# Patient Record
Sex: Female | Born: 1980 | Hispanic: Yes | Smoking: Never smoker
Health system: Southern US, Community
[De-identification: ages and names within clinical notes are randomized; demographics above are authoritative.]

## PROBLEM LIST (undated history)

## (undated) ENCOUNTER — Emergency Department (HOSPITAL_COMMUNITY): Payer: Self-pay | Source: Home / Self Care

## (undated) ENCOUNTER — Inpatient Hospital Stay (HOSPITAL_COMMUNITY): Payer: Self-pay

## (undated) DIAGNOSIS — D259 Leiomyoma of uterus, unspecified: Secondary | ICD-10-CM

## (undated) DIAGNOSIS — N939 Abnormal uterine and vaginal bleeding, unspecified: Secondary | ICD-10-CM

## (undated) DIAGNOSIS — D509 Iron deficiency anemia, unspecified: Secondary | ICD-10-CM

## (undated) DIAGNOSIS — Z973 Presence of spectacles and contact lenses: Secondary | ICD-10-CM

## (undated) DIAGNOSIS — N946 Dysmenorrhea, unspecified: Secondary | ICD-10-CM

## (undated) DIAGNOSIS — G43909 Migraine, unspecified, not intractable, without status migrainosus: Secondary | ICD-10-CM

## (undated) HISTORY — PX: NO PAST SURGERIES: SHX2092

---

## 2004-08-05 ENCOUNTER — Ambulatory Visit: Payer: Self-pay | Admitting: *Deleted

## 2004-08-09 ENCOUNTER — Ambulatory Visit: Payer: Self-pay | Admitting: *Deleted

## 2004-08-13 ENCOUNTER — Inpatient Hospital Stay (HOSPITAL_COMMUNITY): Admission: AD | Admit: 2004-08-13 | Discharge: 2004-08-17 | Payer: Self-pay | Admitting: *Deleted

## 2004-08-13 ENCOUNTER — Ambulatory Visit: Payer: Self-pay | Admitting: Obstetrics and Gynecology

## 2005-09-21 ENCOUNTER — Inpatient Hospital Stay (HOSPITAL_COMMUNITY): Admission: AD | Admit: 2005-09-21 | Discharge: 2005-09-21 | Payer: Self-pay | Admitting: Obstetrics

## 2005-09-28 ENCOUNTER — Inpatient Hospital Stay (HOSPITAL_COMMUNITY): Admission: AD | Admit: 2005-09-28 | Discharge: 2005-09-30 | Payer: Self-pay | Admitting: Obstetrics

## 2010-02-24 ENCOUNTER — Emergency Department (HOSPITAL_COMMUNITY): Admission: EM | Admit: 2010-02-24 | Discharge: 2010-02-24 | Payer: Self-pay | Admitting: Emergency Medicine

## 2010-12-26 LAB — DIFFERENTIAL
Lymphocytes Relative: 25 % (ref 12–46)
Lymphs Abs: 2.1 10*3/uL (ref 0.7–4.0)
Monocytes Relative: 9 % (ref 3–12)
Neutrophils Relative %: 64 % (ref 43–77)

## 2010-12-26 LAB — CBC
MCV: 88.3 fL (ref 78.0–100.0)
Platelets: 240 10*3/uL (ref 150–400)
RBC: 4.56 MIL/uL (ref 3.87–5.11)
WBC: 8.7 10*3/uL (ref 4.0–10.5)

## 2010-12-26 LAB — SEDIMENTATION RATE: Sed Rate: 21 mm/hr (ref 0–22)

## 2013-01-15 ENCOUNTER — Inpatient Hospital Stay (HOSPITAL_COMMUNITY): Payer: Self-pay

## 2013-01-15 ENCOUNTER — Inpatient Hospital Stay (HOSPITAL_COMMUNITY)
Admission: AD | Admit: 2013-01-15 | Discharge: 2013-01-15 | Disposition: A | Discharge status: Home / Self Care | Payer: Self-pay | Source: Ambulatory Visit | Attending: Obstetrics & Gynecology | Admitting: Obstetrics & Gynecology

## 2013-01-15 DIAGNOSIS — O4690 Antepartum hemorrhage, unspecified, unspecified trimester: Secondary | ICD-10-CM

## 2013-01-15 DIAGNOSIS — O209 Hemorrhage in early pregnancy, unspecified: Secondary | ICD-10-CM | POA: Insufficient documentation

## 2013-01-15 DIAGNOSIS — R1032 Left lower quadrant pain: Secondary | ICD-10-CM | POA: Insufficient documentation

## 2013-01-15 LAB — URINALYSIS, ROUTINE W REFLEX MICROSCOPIC
Glucose, UA: NEGATIVE mg/dL
Ketones, ur: NEGATIVE mg/dL
Leukocytes, UA: NEGATIVE
Nitrite: NEGATIVE
Specific Gravity, Urine: 1.01 (ref 1.005–1.030)
pH: 6.5 (ref 5.0–8.0)

## 2013-01-15 LAB — CBC
Hemoglobin: 12.4 g/dL (ref 12.0–15.0)
MCH: 30.2 pg (ref 26.0–34.0)
MCHC: 34.2 g/dL (ref 30.0–36.0)
RBC: 4.11 MIL/uL (ref 3.87–5.11)
RDW: 13 % (ref 11.5–15.5)
WBC: 10 10*3/uL (ref 4.0–10.5)

## 2013-01-15 LAB — WET PREP, GENITAL
Clue Cells Wet Prep HPF POC: NONE SEEN
Yeast Wet Prep HPF POC: NONE SEEN

## 2013-01-15 LAB — POCT PREGNANCY, URINE: Preg Test, Ur: POSITIVE — AB

## 2013-01-15 MED ORDER — KETOROLAC TROMETHAMINE 30 MG/ML IJ SOLN: 30.0000 mg | Freq: Once | INTRAMUSCULAR | Status: DC

## 2013-01-15 MED ORDER — KETOROLAC TROMETHAMINE 30 MG/ML IJ SOLN
INTRAMUSCULAR | Status: AC
  Administered 2013-01-15: 30 mg
  Filled 2013-01-15: qty 1

## 2013-01-15 NOTE — MAU Note (Signed)
Patient states she has had a positive home pregnancy test. States she had slight pink discharge this am with some abdominal cramping.

## 2013-01-15 NOTE — MAU Note (Signed)
In MAU satellite area BP  12/67  Pulse 70  Temp 37.1 C, c/o pain 5/10 lower left, also HA had started since she has been waiting

## 2013-01-15 NOTE — MAU Provider Note (Signed)
History     CSN: [id number]  Arrival date and time: 01/15/13 1436   First Provider Initiated Contact with Patient 01/15/13 1731      Chief Complaint  Patient presents with  . Vaginal Bleeding  . Abdominal Pain   HPI  Pt is G3P2 with positive home pregnancy test today- LMP 11/05/2012. Pt was previously on Depo Dec 2012.  Pt c/o of LLQ pain and spotting today- pain is mild.  Pt also is complaining of right sided headache.  Pt denies chills or fever, constipation, diarrhea, or UTI symptoms.    No past medical history on file.  No past surgical history on file.  No family history on file.  History  Substance Use Topics  . Smoking status: Not on file  . Smokeless tobacco: Not on file  . Alcohol Use: Not on file    Allergies: Allergies not on file  No prescriptions prior to admission    ROS Physical Exam   Blood pressure 109/68, pulse 62, temperature 98.4 F (36.9 C), temperature source Oral, resp. rate 16, height 4\' 10"  (1.473 m), weight 50.894 kg (112 lb 3.2 oz), last menstrual period 11/05/2012, SpO2 100.00%.  Physical Exam  Vitals reviewed. Constitutional: She is oriented to person, place, and time. She appears well-developed and well-nourished.  HENT:  Head: Normocephalic.  Eyes: Pupils are equal, round, and reactive to light.  Neck: Normal range of motion. Neck supple.  Cardiovascular: Normal rate.   Respiratory: Effort normal.  GI: Soft. She exhibits no distension. There is no tenderness. There is no rebound.  Genitourinary:  Small amount creamy discharge in vault; cervix closed, NT; uterus NSSC NT; adnexa slightly tender with palpation- no rebound  Musculoskeletal: Normal range of motion.  Neurological: She is alert and oriented to person, place, and time.  Skin: Skin is warm and dry.  Psychiatric: She has a normal mood and affect.    MAU Course  Procedures Results for orders placed during the hospital encounter of 01/15/13 (from the past 24 hour(s))   URINALYSIS, ROUTINE W REFLEX MICROSCOPIC     Status: None   Collection Time    01/15/13  3:15 PM      Result Value Range   Color, Urine YELLOW  YELLOW   APPearance CLEAR  CLEAR   Specific Gravity, Urine 1.010  1.005 - 1.030   pH 6.5  5.0 - 8.0   Glucose, UA NEGATIVE  NEGATIVE mg/dL   Hgb urine dipstick NEGATIVE  NEGATIVE   Bilirubin Urine NEGATIVE  NEGATIVE   Ketones, ur NEGATIVE  NEGATIVE mg/dL   Protein, ur NEGATIVE  NEGATIVE mg/dL   Urobilinogen, UA 0.2  0.0 - 1.0 mg/dL   Nitrite NEGATIVE  NEGATIVE   Leukocytes, UA NEGATIVE  NEGATIVE  POCT PREGNANCY, URINE     Status: Abnormal   Collection Time    01/15/13  3:23 PM      Result Value Range   Preg Test, Ur POSITIVE (*) NEGATIVE  CBC     Status: None   Collection Time    01/15/13  5:35 PM      Result Value Range   WBC 10.0  4.0 - 10.5 K/uL   RBC 4.11  3.87 - 5.11 MIL/uL   Hemoglobin 12.4  12.0 - 15.0 g/dL   HCT 40.9  81.1 - 91.4 %   MCV 88.3  78.0 - 100.0 fL   MCH 30.2  26.0 - 34.0 pg   MCHC 34.2  30.0 - 36.0 g/dL  RDW 13.0  11.5 - 15.5 %   Platelets 248  150 - 400 K/uL  HCG, QUANTITATIVE, PREGNANCY     Status: Abnormal   Collection Time    01/15/13  5:35 PM      Result Value Range   hCG, Beta Chain, Quant, S 8698 (*) <5 mIU/mL  ABO/RH     Status: None   Collection Time    01/15/13  5:35 PM      Result Value Range   ABO/RH(D) O POS    WET PREP, GENITAL     Status: Abnormal   Collection Time    01/15/13  6:00 PM      Result Value Range   Yeast Wet Prep HPF POC NONE SEEN  NONE SEEN   Trich, Wet Prep NONE SEEN  NONE SEEN   Clue Cells Wet Prep HPF POC NONE SEEN  NONE SEEN   WBC, Wet Prep HPF POC FEW (*) NONE SEEN  US Ob Comp Less 14 Wks  01/15/2013  *RADIOLOGY REPORT*  Clinical Data: Vaginal bleeding.  OBSTETRIC <14 WK ULTRASOUND, TRANSVAGINAL OB US  Technique:  Transabdominal and transvaginal ultrasound was performed for evaluation of the gestation as well as the maternal uterus and adnexal regions.  Findings:   Single intrauterine gestational sac.  This may contain a yolk sac but is barely visible.  No embryo or cardiac activity identified.  The mean sac diameter equals 1.2 the centimeters.  This corresponds to a 6-week-0-day gestation.  [facility] 09/10/2013.  Maternal uterus/adnexae: Small subchorionic hemorrhage is noted.  The ovaries are normal.  No free fluid identified within the pelvis.  IMPRESSION:  1.  Single intrauterine gestational sac which may contain a barely visible yolk sac.  No embryo identified. 2.  Mean sac diameter 1.2 cm without embryo may reflect early pregnancy.  However, the estimated gestational age of [redacted] weeks and 0 days is discordant with the clinical gestational age of [redacted] weeks and 1 day.  Serial quantitative beta HCG and pelvic sonogram is advised. 3.  Small subchorionic hemorrhage   Original Report Authenticated By: Signa Kell, M.D.    US Ob Transvaginal  01/15/2013  *RADIOLOGY REPORT*  Clinical Data: Vaginal bleeding.  OBSTETRIC <14 WK ULTRASOUND, TRANSVAGINAL OB US  Technique:  Transabdominal and transvaginal ultrasound was performed for evaluation of the gestation as well as the maternal uterus and adnexal regions.  Findings:  Single intrauterine gestational sac.  This may contain a yolk sac but is barely visible.  No embryo or cardiac activity identified.  The mean sac diameter equals 1.2 the centimeters.  This corresponds to a 6-week-0-day gestation.  [facility] 09/10/2013.  Maternal uterus/adnexae: Small subchorionic hemorrhage is noted.  The ovaries are normal.  No free fluid identified within the pelvis.  IMPRESSION:  1.  Single intrauterine gestational sac which may contain a barely visible yolk sac.  No embryo identified. 2.  Mean sac diameter 1.2 cm without embryo may reflect early pregnancy.  However, the estimated gestational age of [redacted] weeks and 0 days is discordant with the clinical gestational age of [redacted] weeks and 1 day.  Serial quantitative beta HCG and pelvic sonogram is advised. 3.   Small subchorionic hemorrhage   Original Report Authenticated By: Signa Kell, M.D.   Discussed with Dr. Despina Hidden- will have [pt return in 1 week for ultrasound for viability Assessment and Plan  Bleeding in pregnancy- return in 1 week for repeat ultrasound   Abbrielle Batts 01/15/2013, 5:34 PM

## 2013-01-16 LAB — GC/CHLAMYDIA PROBE AMP: CT Probe RNA: NEGATIVE

## 2013-01-19 ENCOUNTER — Inpatient Hospital Stay (HOSPITAL_COMMUNITY)
Admission: AD | Admit: 2013-01-19 | Discharge: 2013-01-19 | Disposition: A | Discharge status: Home / Self Care | Payer: Self-pay | Source: Ambulatory Visit | Attending: Obstetrics and Gynecology | Admitting: Obstetrics and Gynecology

## 2013-01-19 ENCOUNTER — Encounter (HOSPITAL_COMMUNITY): Payer: Self-pay | Admitting: *Deleted

## 2013-01-19 DIAGNOSIS — O039 Complete or unspecified spontaneous abortion without complication: Secondary | ICD-10-CM | POA: Insufficient documentation

## 2013-01-19 LAB — CBC
MCHC: 34.5 g/dL (ref 30.0–36.0)
MCV: 87.1 fL (ref 78.0–100.0)
Platelets: 241 10*3/uL (ref 150–400)
RDW: 12.6 % (ref 11.5–15.5)
WBC: 12.8 10*3/uL — ABNORMAL HIGH (ref 4.0–10.5)

## 2013-01-19 LAB — HCG, QUANTITATIVE, PREGNANCY: hCG, Beta Chain, Quant, S: 6688 m[IU]/mL — ABNORMAL HIGH (ref <5)

## 2013-01-19 MED ORDER — HYDROCODONE-ACETAMINOPHEN 5-300 MG PO TABS: 1.0000 | ORAL_TABLET | ORAL | Status: DC | PRN

## 2013-01-19 MED ORDER — KETOROLAC TROMETHAMINE 60 MG/2ML IM SOLN
60.0000 mg | Freq: Once | INTRAMUSCULAR | Status: AC
  Administered 2013-01-19: 60 mg via INTRAMUSCULAR
  Filled 2013-01-19: qty 2

## 2013-01-19 NOTE — MAU Note (Signed)
t reports she has had increased pelvic pain x 2 weeks and stomach and darrhea x 3 days. Also reports having n/v x 2-3 days (stated she is throwing up blood). Was seen at [facility] yesterday and the sent her home not knowing what was wrong.

## 2013-01-19 NOTE — MAU Provider Note (Signed)
  History     CSN: [id number]  Arrival date and time: 01/19/13 0865   First Provider Initiated Contact with Patient 01/19/13 2005      Chief Complaint  Patient presents with  . Vaginal Bleeding  . Abdominal Pain   HPI  Diane Vaughn is a 32 y.o. G3P2002 at [redacted]w[redacted]d who presents today with pain and bleeding. She states that she has been bleeding since Wednesday, and then today she started having 10/10 pain. She states that the bleeding is heavier than a period, and she is passing large clots.    No past medical history on file.  No past surgical history on file.  No family history on file.  History  Substance Use Topics  . Smoking status: Never Smoker   . Smokeless tobacco: Never Used  . Alcohol Use: No    Allergies: No Known Allergies  Prescriptions prior to admission  Medication Sig Dispense Refill  . Prenatal Vit-Fe Fumarate-FA (PRENATAL MULTIVITAMIN) TABS Take 1 tablet by mouth daily at 12 noon.        Review of Systems  Constitutional: Negative for fever.  Eyes: Negative for blurred vision.  Respiratory: Negative for shortness of breath.   Cardiovascular: Negative for chest pain.  Gastrointestinal: Positive for abdominal pain. Negative for nausea, vomiting, diarrhea and constipation.  Genitourinary: Negative for dysuria, urgency and frequency.  Musculoskeletal: Negative for myalgias.  Neurological: Negative for dizziness and headaches.   Physical Exam   Blood pressure 106/72, pulse 92, temperature 98.2 F (36.8 C), temperature source Oral, resp. rate 18, height 4\' 10"  (1.473 m), weight 111 lb (50.349 kg), last menstrual period 11/05/2012, SpO2 100.00%.  Physical Exam  Nursing note and vitals reviewed. Constitutional: She is oriented to person, place, and time. She appears well-developed and well-nourished. No distress.  Cardiovascular: Normal rate.   Respiratory: Effort normal.  GI: Soft.  Genitourinary:   External: no lesion Vagina: large  amount of blood and clots in the vagina Cervix: small amount of decidual appearing tissue in the os, easily removed. Cervical os slightly dilated Uterus: small, non-tender  Neurological: She is alert and oriented to person, place, and time.  Skin: Skin is warm and dry.  Psychiatric: She has a normal mood and affect.    MAU Course  Procedures  Results for orders placed during the hospital encounter of 01/19/13 (from the past 24 hour(s))  HCG, QUANTITATIVE, PREGNANCY     Status: Abnormal   Collection Time    01/19/13  8:21 PM      Result Value Range   hCG, Beta Chain, Sharene Butters, [place] 7846 (*) <5 mIU/mL  CBC     Status: Abnormal   Collection Time    01/19/13  8:21 PM      Result Value Range   WBC 12.8 (*) 4.0 - 10.5 K/uL   RBC 3.96  3.87 - 5.11 MIL/uL   Hemoglobin 11.9 (*) 12.0 - 15.0 g/dL   HCT 96.2 (*) 95.2 - 84.1 %   MCV 87.1  78.0 - 100.0 fL   MCH 30.1  26.0 - 34.0 pg   MCHC 34.5  30.0 - 36.0 g/dL   RDW 32.4  40.1 - 02.7 %   Platelets 241  150 - 400 K/uL     Assessment and Plan   1. Abortion, spontaneous    With falling HCG levels Bleeding precautions given FU in 2 weeks for repeat HCG.   Tawnya Crook 01/19/2013, 8:22 PM

## 2013-01-19 NOTE — MAU Note (Signed)
Specimen was sent to pathology.

## 2013-01-19 NOTE — MAU Note (Signed)
Pt reports continued vaginal bleeding and pain, was here on 01/15/2013. States has been bleeding since but it increased today and pain worsened

## 2013-01-21 NOTE — MAU Provider Note (Signed)
Attestation of Attending Supervision of Advanced Practitioner: Evaluation and management procedures were performed by the PA/NP/CNM/OB Fellow under my supervision/collaboration. Chart reviewed and agree with management and plan.  Gaius Ishaq V 01/21/2013 12:00 PM

## 2013-01-22 ENCOUNTER — Ambulatory Visit (HOSPITAL_COMMUNITY): Admission: RE | Admit: 2013-01-22 | Payer: Self-pay | Source: Ambulatory Visit

## 2013-02-13 ENCOUNTER — Other Ambulatory Visit: Payer: Self-pay

## 2013-02-13 DIAGNOSIS — O039 Complete or unspecified spontaneous abortion without complication: Secondary | ICD-10-CM

## 2013-02-27 ENCOUNTER — Encounter: Payer: Self-pay | Admitting: Obstetrics & Gynecology

## 2013-02-27 ENCOUNTER — Ambulatory Visit (INDEPENDENT_AMBULATORY_CARE_PROVIDER_SITE_OTHER): Payer: Self-pay | Admitting: Obstetrics & Gynecology

## 2013-02-27 VITALS — BP 111/77 | HR 80 | Temp 97.8°F | Ht <= 58 in | Wt 115.2 lb

## 2013-02-27 DIAGNOSIS — O039 Complete or unspecified spontaneous abortion without complication: Secondary | ICD-10-CM

## 2013-02-27 DIAGNOSIS — Z3009 Encounter for other general counseling and advice on contraception: Secondary | ICD-10-CM | POA: Insufficient documentation

## 2013-02-27 MED ORDER — NORGESTIM-ETH ESTRAD TRIPHASIC 0.18/0.215/0.25 MG-35 MCG PO TABS: 1.0000 | ORAL_TABLET | Freq: Every day | ORAL | Status: DC

## 2013-02-27 NOTE — Progress Notes (Signed)
Patient ID: Diane Vaughn, female   DOB: Aug 15, 1981, 32 y.o.   MRN: [id number]  Chief Complaint  Patient presents with  . Follow-up    SAB; no bleeding     HPI Diane Vaughn is a 32 y.o. female.  [id number] Patient's last menstrual period was 02/19/2013. Spontaneous miscarriage 4/13 at 6 weeks by [place], menses started 1 week ago, wants DMPA but cant get into [facility]. Would try [facility]   HPI  No past medical history on file.  No past surgical history on file.  No family history on file.  Social History History  Substance Use Topics  . Smoking status: Never Smoker   . Smokeless tobacco: Never Used  . Alcohol Use: No    No Known Allergies  Current Outpatient Prescriptions  Medication Sig Dispense Refill  . Prenatal Vit-Fe Fumarate-FA (PRENATAL MULTIVITAMIN) TABS Take 1 tablet by mouth daily at 12 noon.      . Hydrocodone-Acetaminophen (VICODIN) 5-300 MG TABS Take 1 tablet by mouth every 4 (four) hours as needed (pain).  15 each  0  . Norgestimate-Ethinyl Estradiol Triphasic 0.18/0.215/0.25 MG-35 MCG tablet Take 1 tablet by mouth daily.  1 Package  11   No current facility-administered medications for this visit.    Review of Systems Review of Systems  Constitutional: Negative for fever.  Genitourinary: Positive for vaginal bleeding. Negative for dysuria, vaginal discharge, menstrual problem and pelvic pain.    Blood pressure 111/77, pulse 80, temperature 97.8 F (36.6 C), temperature source Oral, height 4\' 9"  (1.448 m), weight 115 lb 3.2 oz (52.254 kg), last menstrual period 02/19/2013, unknown if currently breastfeeding.  Physical Exam Physical Exam  Constitutional: She appears well-developed. No distress.  Pulmonary/Chest: No respiratory distress.  Psychiatric: She has a normal mood and affect. Her behavior is normal.    Data Reviewed [place], MAU note  Assessment    Complete Sab     Plan    Tri Sprintec         ARNOLD,JAMES 02/27/2013, 3:04  PM

## 2013-02-27 NOTE — Patient Instructions (Signed)
Uso de los anticonceptivos orales  (Oral Contraception Use) Los anticonceptivos orales son medicamentos que se utilizan para Location manager. Su funcin es [employer] ovarios liberen vulos. Las hormonas de los anticonceptivos orales hacen que el moco cervical se haga ms espeso, lo que evita que el esperma ingrese al tero. Tambin hacen que la membrana que tapiza el tero se vuelva ms fina, lo que no permite que el huevo fertilizado se adhiera a la pared del tero. Los anticonceptivos orales son muy efectivos cuando se toman exactamente como se prescriben. Sin embargo, los anticonceptivos orales no previenen contra las enfermedades de transmisin sexual (ETS). La prctica del sexo seguro, como el uso de preservativos, junto con los anticonceptivos [place], [place] a prevenir ese tipo de enfermedades. Antes de tomar la pldora, usted debe hacerse un examen fsico y un [place]. El mdico podr indicarle anlisis de [place], si es necesario. El mdico se asegurar de que usted es una buena candidata para usar anticonceptivos orales. Converse con su mdico acerca de los posibles efectos secundarios de los anticonceptivos [place]. Cuando se inicia el uso de anticonceptivos [place], se pueden tomar durante 2 a 3 meses para que el cuerpo se adapte a los cambios en los niveles hormonales en el cuerpo.  CMO TOMAR LOS ANTICONCEPTIVOS ORALES  El mdico le indicar como comenzar a Surveyor, minerals ciclo de anticonceptivos orales. De lo contrario usted puede:   Psychiatric nurse. da del ciclo menstrual. No necesitar proteccin anticonceptiva adicional al Investment banker, operational.  Comenzar Financial risk analyst domingo luego de su perodo menstrual, o Medical laboratory scientific officer en que adquiere el [employer]. En estos casos deber [employer] proteccin anticonceptiva [employer] primeros 7 das del [place]. Luego de comenzar a tomar los anticonceptivos orales:   Si olvid de tomar 1 pldora, tmela tan pronto como lo recuerde. Tome la  siguiente pldora a la hora habitual.  Si olvida tomar 2  ms pldoras, utilice un mtodo anticonceptivo adicional hasta que comience su prximo perodo menstrual.  Si utiliza el envase de 28 pldoras y [place] tomar 1 de las ltimas 7 (pldoras sin hormonas), sto no tiene [place] manager. Simplemente deseche el resto de las pldoras que no contienen hormonas y comience un nuevo envase. No importa cuando comience a tomar los anticonceptivos, siempre empiece un nuevo envase el mismo da de la [place]. Tenga un envase extra de pldoras anticonceptivas y use un mtodo anticonceptivo adicional para el caso en que se olvide de tomar algunas pldoras o pierda la caja.  INSTRUCCIONES PARA EL CUIDADO DOMICILIARIO  No fume.  Siempre use un condn para protegerse de las enfermedades de transmisin sexual. Los anticonceptivos orales no protegen contra las enfermedades de transmisin sexual.  Research scientist (medical) en un calendario las fechas en las que tiene sus perodos [place].  Lea la informacin y consejos que vienen con las pldoras. Pngase en contacto con el mdico siempre que tenga preguntas. SOLICITE ATENCIN MDICA SI:  Presenta nuseas o vmitos.  Tiene flujo o sangrado vaginal anormal.  Aparece una erupcin cutnea.  No tiene el perodo menstrual.  Pierde el cabello.  Necesita tratamiento por cambios en su estado de nimo o por depresin.  Se siente mareada al tomar la pldora.  Comienza a aparecer acn con el uso de los anticonceptivos orales.  Ardelle Anton. SOLICITE ATENCIN MDICA DE INMEDIATO SI:  Siente dolor en el pecho.  Le falta el aire.  Le duele mucho la cabeza y no puede Human resources officer.  Siente adormecimiento o tiene  dificultad para hablar.  Tiene problemas de visin.  Presenta dolor, inflamacin o hinchazn en las piernas. Document Released: 09/14/2011 Document Revised: 12/18/2011 [facility] Patient Information 2014 [place], [place].

## 2014-02-26 IMAGING — US US OB COMP LESS 14 WK
1 series · 14 of 28 positions shown · non-contrast
Comparison: none

CLINICAL DATA: Vaginal bleeding.

OBSTETRIC <14 WK ULTRASOUND, TRANSVAGINAL OB US
TECHNIQUE: Transabdominal and transvaginal ultrasound was
performed for evaluation of the gestation as well as the maternal
uterus and adnexal regions.

[Series 1: us ob comp less 14 wks · 14 of 35 slices shown]
[im 2/35]
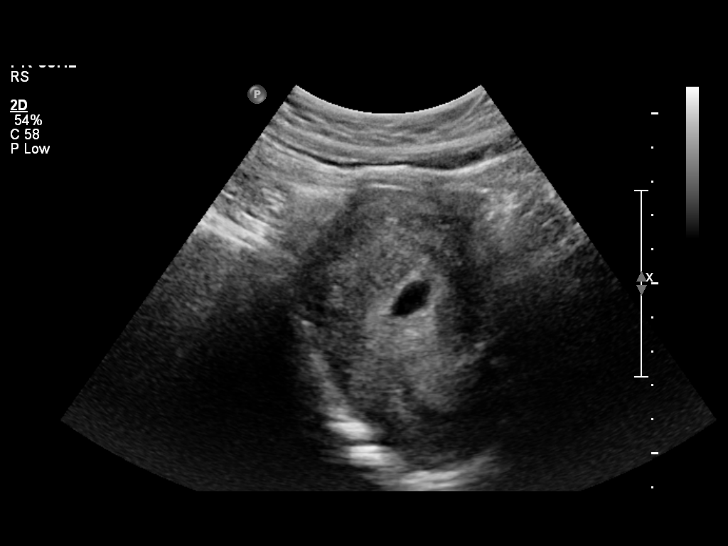
[im 4/35]
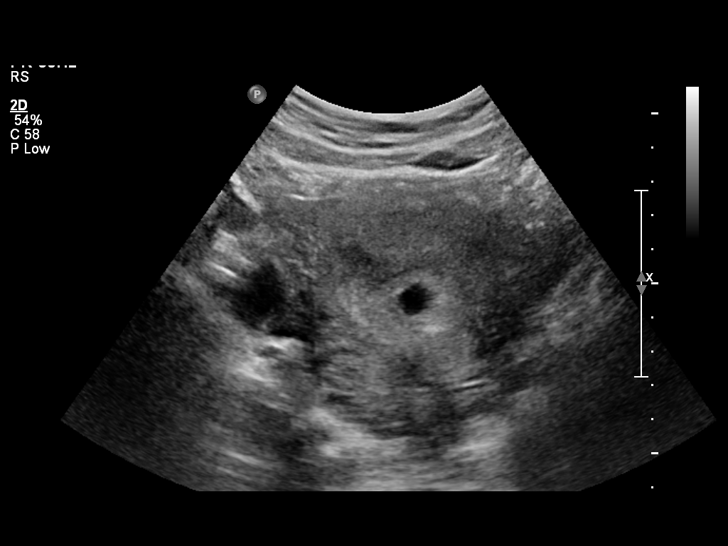
[im 7/35]
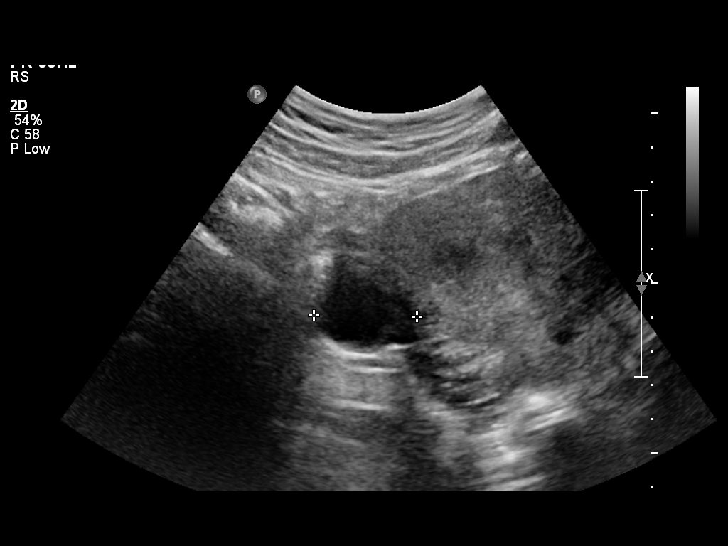
[im 9/35]
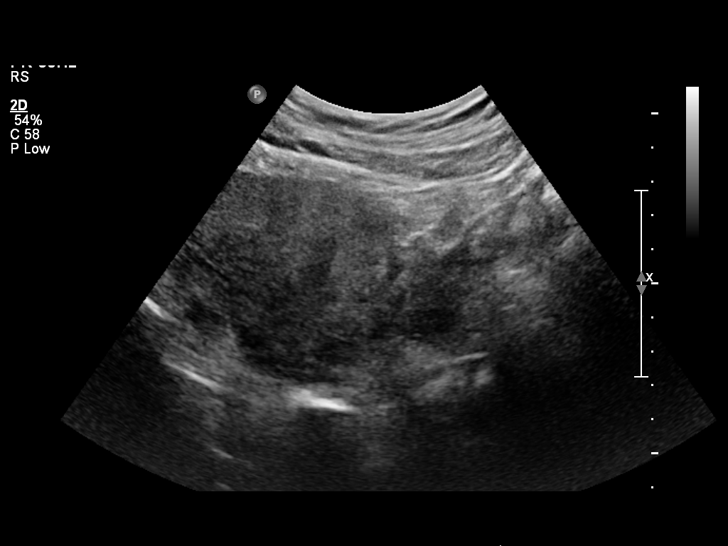
[im 12/35]
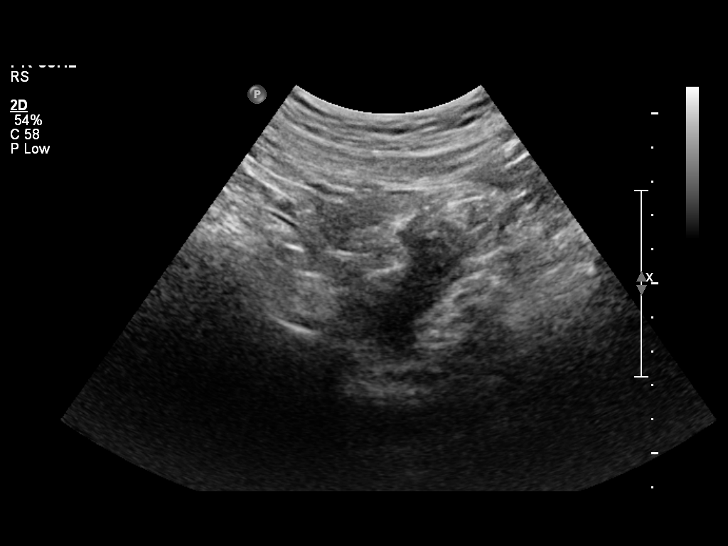
[im 14/35]
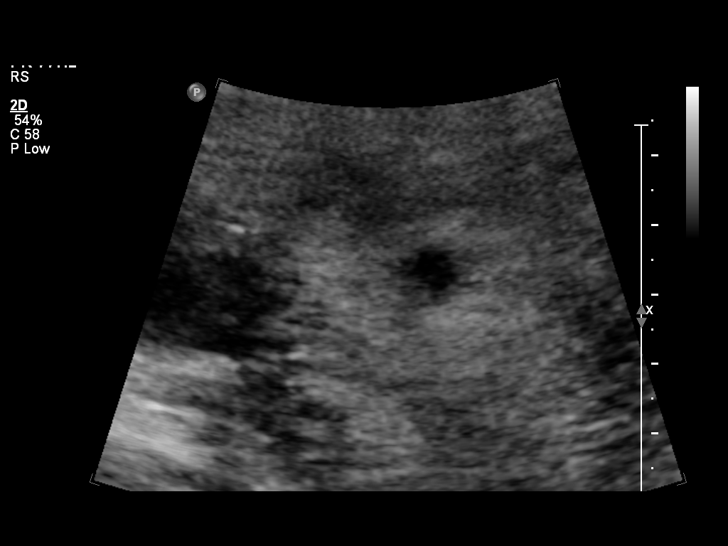
[im 17/35]
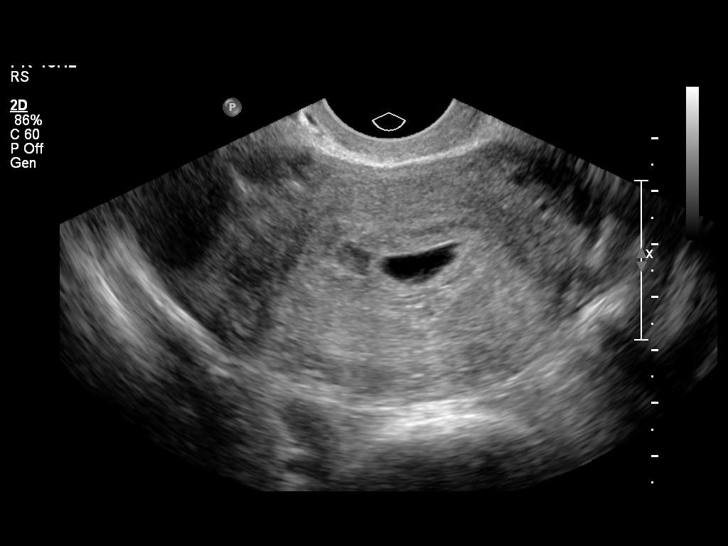
[im 19/35]
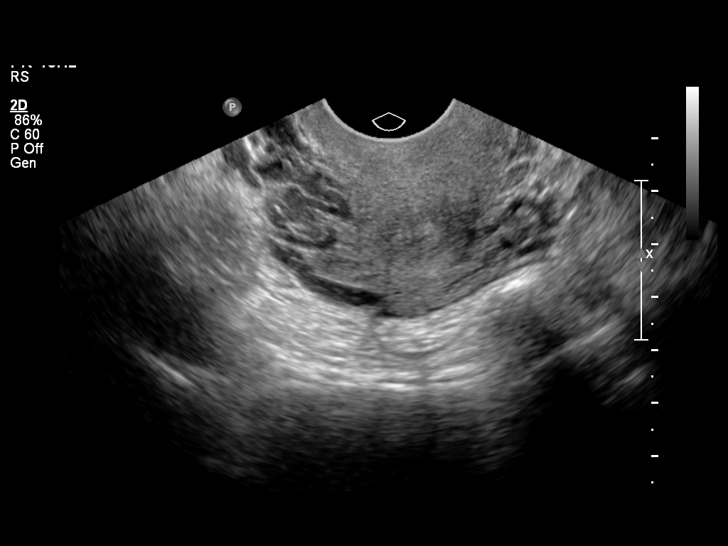
[im 22/35]
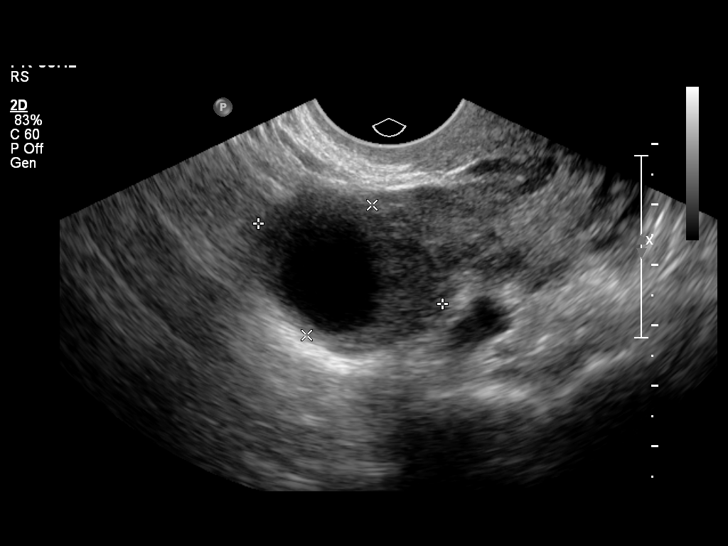
[im 24/35]
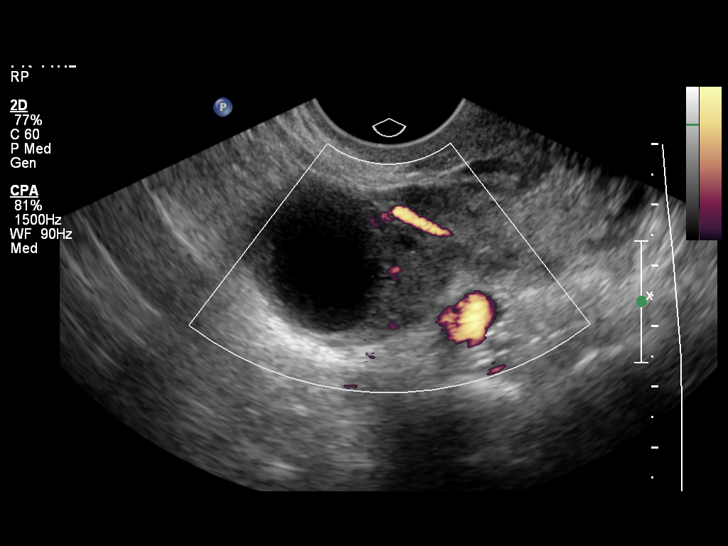
[im 27/35]
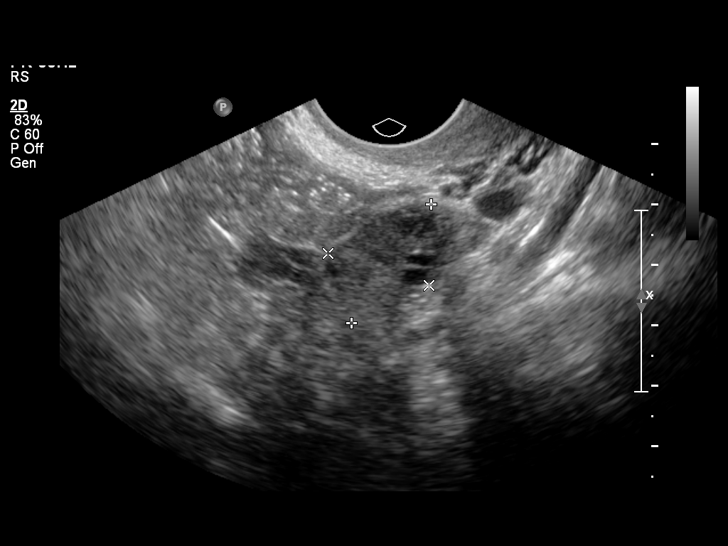
[im 29/35]
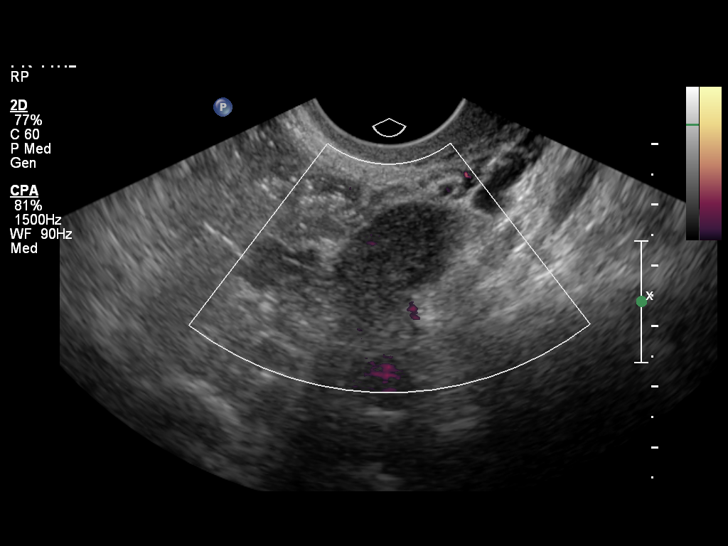
[im 32/35]
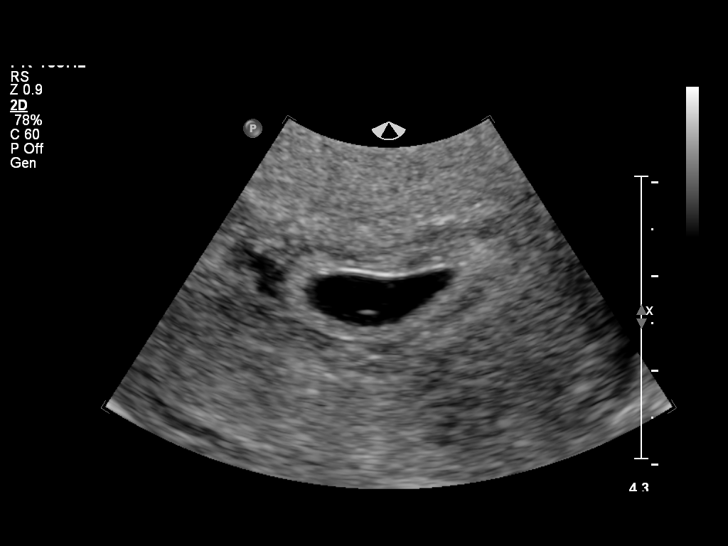
[im 35/35]
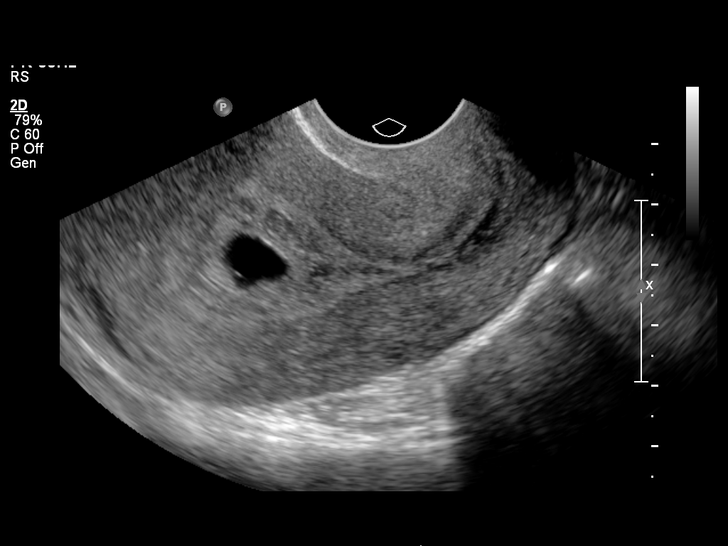

[14 of 28 positions shown; findings below may reference images not displayed]

FINDINGS: Single intrauterine gestational sac.  This may contain a yolk sac
but is barely visible.

No embryo or cardiac activity identified.

The mean sac diameter equals 1.2 the centimeters.  This corresponds
to a 6-week-4-day gestation.

[facility] 09/10/2013.

Maternal uterus/adnexae:
Small subchorionic hemorrhage is noted.

The ovaries are normal.

No free fluid identified within the pelvis.
IMPRESSION: 1.  Single intrauterine gestational sac which may contain a barely
visible yolk sac.  No embryo identified.
2.  Mean sac diameter 1.2 cm without embryo may reflect early
pregnancy.  However, the estimated gestational age of 6 weeks and 0
days is discordant with the clinical gestational age of 10 weeks
and 1 day.  Serial quantitative beta HCG and pelvic sonogram is
advised.
3.  Small subchorionic hemorrhage

## 2014-08-10 ENCOUNTER — Encounter: Payer: Self-pay | Admitting: Obstetrics & Gynecology

## 2015-10-08 ENCOUNTER — Encounter: Payer: Self-pay | Admitting: Neurology

## 2015-10-08 ENCOUNTER — Ambulatory Visit (INDEPENDENT_AMBULATORY_CARE_PROVIDER_SITE_OTHER): Payer: Self-pay | Admitting: Neurology

## 2015-10-08 VITALS — BP 100/68 | HR 66 | Ht <= 58 in | Wt 109.3 lb

## 2015-10-08 DIAGNOSIS — IMO0002 Reserved for concepts with insufficient information to code with codable children: Secondary | ICD-10-CM

## 2015-10-08 DIAGNOSIS — G43709 Chronic migraine without aura, not intractable, without status migrainosus: Secondary | ICD-10-CM

## 2015-10-08 MED ORDER — AMITRIPTYLINE HCL 10 MG PO TABS: 10.0000 mg | ORAL_TABLET | Freq: Every day | ORAL | Status: DC

## 2015-10-08 MED ORDER — NAPROXEN 500 MG PO TABS: 500.0000 mg | ORAL_TABLET | Freq: Two times a day (BID) | ORAL | Status: DC | PRN

## 2015-10-08 MED ORDER — SUMATRIPTAN SUCCINATE 100 MG PO TABS: ORAL_TABLET | ORAL | Status: DC

## 2015-10-08 NOTE — Progress Notes (Signed)
NEUROLOGY CONSULTATION NOTE  Lavena Loretto MRN: [id number] DOB: 1981-05-17  Referring provider: Caffie Pinto, PA Primary care provider: none  Reason for consult:  migraine  HISTORY OF PRESENT ILLNESS: Diane Vaughn is a 34 year old right-handed Spanish-speaking female who presents for migraines.  Spanish interpretor present.  History obtained by patient via Spanish interpretor, as well as PCP note.  Onset:  Since 34 years old, increased frequency for 3 years Location:  Right sided, peri-orbital/temporl Quality:  throbbing Intensity:  10/10 when severe Aura:  Visual (blurred vision in right eye, followed by stars) Prodrome:  no Associated symptoms:  Nausea, vomiting, photophobia, phonophobia Duration:  3 days Frequency:  Every week (approximately 20 headache days per month, 8 days severe) Triggers/exacerbating factors:  Lack of sleep Relieving factors:  Fioricet (lessens pain) Activity:  Cannot function when severe  Past abortive medication:  Ibuprofen, Advil, Tylenol Past preventative medication:  none Other past therapy:  Teas  Current abortive medication:  Bupap -  (takes only when severe) Antihypertensive medications:  none Antidepressant medications:  none Anticonvulsant medications:  none Vitamins/Herbal/Supplements:  none Other therapy:  none Other medication:  none  Caffeine:  2-3 cups of coffee daily Alcohol:  no Smoker:  no Diet:  Does not keep hydrated Exercise:  no Depression/stress:  Work-related stress (works at [employer] for breakfast shift) Sleep hygiene:  Poor (gets up at 3 am to go to work) Family history of headache:  Mother's side  PAST MEDICAL HISTORY: History reviewed. No pertinent past medical history.  PAST SURGICAL HISTORY: History reviewed. No pertinent past surgical history.  MEDICATIONS: Current Outpatient Prescriptions on File Prior to Visit  Medication Sig Dispense Refill  . Norgestimate-Ethinyl Estradiol  Triphasic 0.18/0.215/0.25 MG-35 MCG tablet Take 1 tablet by mouth daily. 1 Package 11   No current facility-administered medications on file prior to visit.    ALLERGIES: No Known Allergies  FAMILY HISTORY: History reviewed. No pertinent family history.  SOCIAL HISTORY: Social History   Social History  . Marital Status: Single    Spouse Name: N/A  . Number of Children: N/A  . Years of Education: N/A   Occupational History  . Not on file.   Social History Main Topics  . Smoking status: Never Smoker   . Smokeless tobacco: Never Used  . Alcohol Use: No  . Drug Use: No  . Sexual Activity: Yes    Birth Control/ Protection: None   Other Topics Concern  . Not on file   Social History Narrative    REVIEW OF SYSTEMS: Constitutional: No fevers, chills, or sweats, no generalized fatigue, change in appetite Eyes: No visual changes, double vision, eye pain Ear, nose and throat: No hearing loss, ear pain, nasal congestion, sore throat Cardiovascular: No chest pain, palpitations Respiratory:  No shortness of breath at rest or with exertion, wheezes GastrointestinaI: No nausea, vomiting, diarrhea, abdominal pain, fecal incontinence Genitourinary:  No dysuria, urinary retention or frequency Musculoskeletal:  No neck pain, back pain Integumentary: No rash, pruritus, skin lesions Neurological: as above Psychiatric: No depression, insomnia, anxiety Endocrine: No palpitations, fatigue, diaphoresis, mood swings, change in appetite, change in weight, increased thirst Hematologic/Lymphatic:  No anemia, purpura, petechiae. Allergic/Immunologic: no itchy/runny eyes, nasal congestion, recent allergic reactions, rashes  PHYSICAL EXAM: Filed Vitals:   10/08/15 0811  BP: 100/68  Pulse: 66   General: No acute distress.  Patient appears well-groomed.  Head:  Normocephalic/atraumatic Eyes:  fundi unremarkable, without vessel changes, exudates, hemorrhages or papilledema. Neck: supple, no  paraspinal tenderness,  full range of motion Back: No paraspinal tenderness Heart: regular rate and rhythm Lungs: Clear to auscultation bilaterally. Vascular: No carotid bruits. Neurological Exam: Mental status: alert and oriented to person, place, and time, recent and remote memory intact, fund of knowledge intact, attention and concentration intact, speech fluent and not dysarthric, language intact. Cranial nerves: CN I: not tested CN II: pupils equal, round and reactive to light, visual fields intact, fundi unremarkable, without vessel changes, exudates, hemorrhages or papilledema. CN III, IV, VI:  full range of motion, no nystagmus, no ptosis CN V: facial sensation intact CN VII: upper and lower face symmetric CN VIII: hearing intact CN IX, X: gag intact, uvula midline CN XI: sternocleidomastoid and trapezius muscles intact CN XII: tongue midline Bulk & Tone: normal, no fasciculations. Motor:  5/5 throughout Sensation: temperature and vibration sensation intact. Deep Tendon Reflexes:  2+ throughout, toes downgoing.  Finger to nose testing:  Without dysmetria.  Heel to shin:  Without dysmetria.  Gait:  Normal station and stride.  Able to turn and tandem walk. Romberg negative.  IMPRESSION: Chronic migraine  PLAN: 1.  She thinks she may be pregnant because she missed her period but first home pregnancy test was negative.  She must follow up with the health clinic to verify.  If she is pregnant, then treatment is limited.  She may take magnesium oxide 400mg  to 600mg  daily and tylenol as needed. 2.  If she is not pregnant, we can start amitriptyline 10mg  at bedtime and we can prescribe sumatriptan 100mg  with naproxen 500mg  for abortive therapy.  She should stop Fioricet. 3.  Lifestyle modification discussed 4.  Follow up in 3 months  45 minutes spent face to face with patient, over 50% spent discussing diagnosis and management.  Thank you for allowing me to take part in the care of  this patient.  Shon MilletAdam Mykaylah Ballman, DO  CC:  Lindaann PascalScott Long, [place]

## 2015-10-08 NOTE — Patient Instructions (Addendum)
Migraa Recomendaciones: 1. Comience a amitriptilina 10 mg a la hora de [employer] Llame en 4 semanas con la actualizacin y podemos ajustar la dosis si es necesario. 2. Tomar sumatriptn en la ms temprana aparicin de dolor de [place]. Puede repetir la dosis una vez en 2 horas si es necesario. No exceda de dos tabletas en 24 horas. Si esto es ineficaz, tome cada dosis de sumatriptn con naproxeno 500 mg 3. Limite el uso de analgsicos a no ms de 2 das de [employer]. Estos medicamentos incluyen acetaminofeno, ibuprofeno, triptanos y narcticos. Esto ayudar a Software engineer de dolores de cabeza de rebote. 4. Tenga en cuenta los desencadenantes comunes de los alimentos, tales como los dulces procesados, los alimentos procesados ??con nitritos (tales como carne deli, perritos calientes, salchichas), alimentos con MSG, alcohol (como el vino), chocolate, ciertos quesos, , Algunos ctricos), vinagre, soda diettica. 4. Evite la cafena 5. Ejercicio de rutina 6. Buena higiene del sueo 7. Mantngase adecuadamente hidratado con agua 8. Mantenga un diario de dolor de [place]. 9. Mantener una gestin adecuada del estrs. 10. No salte las comidas. 11. Considere suplementos: xido de magnesio  a  diario, riboflavina , Coenzyme Q 10  tres veces al da 12. Seguimiento en 3 meses, pero LLAME EN 4 SEMANAS CON ACTUALIZACIN  SI EST EMBARAZADA, NO PUEDE TOMAR LA AMITRIPTYLINE O SUMATRIPTAN. USTED PUEDE TOMAR EL MAGNESIO  A  DIARIAMENTE Y SLO TYLENOL AS NECESARIO. VERIFIQUE CON SU MDICO SI EST EMBARAZADA O NO. SI USTED NO ES, ENTONCES CIF USTED EST EMBARAZADA, NO PUEDE TOMAR LA AMITRIPTYLINE O SUMATRIPTAN. USTED PUEDE TOMAR EL MAGNESIO  A  DIARIAMENTE Y SLO TYLENOL AS NECESARIO. VERIFIQUE CON SU MDICO SI EST EMBARAZADA O NO. SI USTED NO ES, ENTONCES PODEMOS PRESCRIBIR AQUELLOS MEDICAMENTOS.EN PRESCRIBIR ESTOS MEDICAMENTOS.     Recommendations: 1.  Start  amitriptyline  at bedtime.  Call in 4 weeks with update and we can adjust dose if needed. 2.  Take sumatriptan at earliest onset of headache.  May repeat dose once in 2 hours if needed.  Do not exceed two tablets in 24 hours.  If this is ineffective, then take each dose of sumatriptan with naproxen  3.  Limit use of pain relievers to no more than 2 days out of the week.  These medications include acetaminophen, ibuprofen, triptans and narcotics.  This will help reduce risk of rebound headaches. 4.  Be aware of common food triggers such as processed sweets, processed foods with nitrites (such as deli meat, hot dogs, sausages), foods with MSG, alcohol (such as wine), chocolate, certain cheeses, certain fruits (dried fruits, some citrus fruit), vinegar, diet soda. 4.  Avoid caffeine 5.  Routine exercise 6.  Proper sleep hygiene 7.  Stay adequately hydrated with water 8.  Keep a headache diary. 9.  Maintain proper stress management. 10.  Do not skip meals. 11.  Consider supplements:  Magnesium oxide  to  daily, riboflavin , Coenzyme Q 10  three times daily 12.  Follow up in 3 months but CALL IN 4 WEEKS WITH UPDATE   IF YOU ARE PREGNANT, YOU CANNOT TAKE THE AMITRIPTYLINE OR SUMATRIPTAN.  YOU MAY TAKE THE MAGNESIUM  TO  DAILY AND ONLY TYLENOL AS NEEDED.  VERIFY WITH YOUR DOCTOR WHETHER YOU ARE PREGNANT OR NOT.  IF YOU ARE NOT, THEN WE CIF YOU ARE PREGNANT, YOU CANNOT TAKE THE AMITRIPTYLINE OR SUMATRIPTAN.  YOU MAY TAKE THE MAGNESIUM  TO  DAILY AND ONLY TYLENOL AS NEEDED.  VERIFY  WITH YOUR DOCTOR WHETHER YOU ARE PREGNANT OR NOT.  IF YOU ARE NOT, THEN WE CAN PRESCRIBE THOSE MEDICATIONS.AN PRESCRIBE THOSE MEDICATIONS.

## 2015-10-08 NOTE — Progress Notes (Signed)
Note routed

## 2015-10-12 ENCOUNTER — Other Ambulatory Visit: Payer: Self-pay | Admitting: *Deleted

## 2015-10-12 MED ORDER — NAPROXEN 500 MG PO TABS: 500.0000 mg | ORAL_TABLET | Freq: Two times a day (BID) | ORAL | Status: DC | PRN

## 2015-10-12 MED ORDER — AMITRIPTYLINE HCL 10 MG PO TABS: 10.0000 mg | ORAL_TABLET | Freq: Every day | ORAL | Status: DC

## 2015-10-12 MED ORDER — SUMATRIPTAN SUCCINATE 100 MG PO TABS: 100.0000 mg | ORAL_TABLET | ORAL | Status: DC | PRN

## 2016-01-19 ENCOUNTER — Encounter: Payer: Self-pay | Admitting: Neurology

## 2016-01-19 ENCOUNTER — Ambulatory Visit (INDEPENDENT_AMBULATORY_CARE_PROVIDER_SITE_OTHER): Payer: Self-pay | Admitting: Neurology

## 2016-01-19 VITALS — BP 120/76 | HR 88 | Ht <= 58 in | Wt 110.2 lb

## 2016-01-19 DIAGNOSIS — G43009 Migraine without aura, not intractable, without status migrainosus: Secondary | ICD-10-CM

## 2016-01-19 MED ORDER — RIZATRIPTAN BENZOATE 10 MG PO TABS: ORAL_TABLET | ORAL | Status: DC

## 2016-01-19 NOTE — Patient Instructions (Addendum)
1. Contine la amitriptilina 10 mg a la hora de [place]. Yo continuara los suplementos, ya que usted est haciendo bien. Puede detenerlos y si Performance Food Groupvuelven los dolores de [place], basta con reiniciarlos. 2. Detener el sumatriptn. En su lugar, tomar Maxalt (rizatriptan) 10 mg en el inicio ms temprano de dolor de cabeza. Puede repetir la dosis en 2 horas si es necesario. No exceda de 2 dosis en 24 horas. Usted puede intentar tomar cada dosis de rizatriptn con 500 mg de naproxeno. 3. Trate de mantener el mismo horario de sueo el domingo para ver si eso evita el dolor de cabeza 4. Seguimiento en 5 meses, pero llame con preguntas o inquietudes.

## 2016-01-19 NOTE — Progress Notes (Signed)
NEUROLOGY FOLLOW UP OFFICE NOTE  Diane Vaughn [id number]  HISTORY OF PRESENT ILLNESS: Diane Vaughn is a 35 year old right-handed Spanish-speaking female who follows up for migraines.  Spanish interpretor present.    UPDATE: She was not pregnant, so she was prescribed amitriptyline and sumatriptan .  She has had improvement in headaches, however she had side effects to sumatriptan Intensity:  5/10 Duration:  4-5 hours Frequency:  3 days in month of March Current NSAIDS:  Naproxen  Current analgesics:  no Current triptans:  Sumatriptan 50 to  Current anti-emetic:  no Current muscle relaxants:  no Current anti-anxiolytic:  no Current sleep aide:  no Current Antihypertensive medications:  no Current Antidepressant medications:  Amitriptyline  Current Anticonvulsant medications:  no Current Vitamins/Herbal/Supplements:  Magnesium, riboflavin Current Antihistamines/Decongestants:  No  She notices that on Sundays, when she sleeps longer since it is her day off, her headaches seem to occur more frequently.  HISTORY: Onset:  Since 35 years old, increased frequency for 3 years Location:  Right sided, peri-orbital/temporl Quality:  throbbing Intensity:  10/10 when severe Aura:  Visual (blurred vision in right eye, followed by stars) Prodrome:  no Associated symptoms:  Nausea, vomiting, photophobia, phonophobia Duration:  3 days Frequency:  Every week (approximately 20 headache days per month, 8 days severe) Triggers/exacerbating factors:  Lack of sleep Relieving factors:  Fioricet (lessens pain) Activity:  Cannot function when severe  Past abortive medication:  Ibuprofen, Advil, Tylenol Past preventative medication:  none Other past therapy:  Teas  Current abortive medication:  Bupap -  (takes only when severe) Antihypertensive medications:  none Antidepressant medications:  none Anticonvulsant medications:   none Vitamins/Herbal/Supplements:  none Other therapy:  none Other medication:  none  Caffeine:  2-3 cups of coffee daily Alcohol:  no Smoker:  no Diet:  Does not keep hydrated Exercise:  no Depression/stress:  Work-related stress (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  MEDICATIONS: Current Outpatient Prescriptions on File Prior to Visit  Medication Sig Dispense Refill  . amitriptyline (ELAVIL) 10 MG tablet Take 1 tablet (10 mg total) by mouth at bedtime. 30 tablet 3  . naproxen (NAPROSYN) 500 MG tablet Take 1 tablet (500 mg total) by mouth every 12 (twelve) hours as needed. 60 tablet 2  . Norgestimate-Ethinyl Estradiol Triphasic 0.18/0.215/0.25 MG-35 MCG tablet Take 1 tablet by mouth daily. 1 Package 11   No current facility-administered medications on file prior to visit.    ALLERGIES: No Known Allergies  FAMILY HISTORY: No family history on file.  SOCIAL HISTORY: Social History   Social History  . Marital Status: Single    Spouse Name: N/A  . Number of Children: N/A  . Years of Education: N/A   Occupational History  . Not on file.   Social History Main Topics  . Smoking status: Never Smoker   . Smokeless tobacco: Never Used  . Alcohol Use: No  . Drug Use: No  . Sexual Activity: Yes    Birth Control/ Protection: None   Other Topics Concern  . Not on file   Social History Narrative    REVIEW OF SYSTEMS: Constitutional: No fevers, chills, or sweats, no generalized fatigue, change in appetite Eyes: No visual changes, double vision, eye pain Ear, nose and throat: No hearing loss, ear pain, nasal congestion, sore throat Cardiovascular: No  chest pain, palpitations Respiratory:  No shortness of breath at rest or with exertion, wheezes GastrointestinaI: No nausea, vomiting, diarrhea, abdominal pain, fecal  incontinence Genitourinary:  No dysuria, urinary retention or frequency Musculoskeletal:  No neck pain, back pain Integumentary: No rash, pruritus, skin lesions Neurological: as above Psychiatric: No depression, insomnia, anxiety Endocrine: No palpitations, fatigue, diaphoresis, mood swings, change in appetite, change in weight, increased thirst Hematologic/Lymphatic:  No anemia, purpura, petechiae. Allergic/Immunologic: no itchy/runny eyes, nasal congestion, recent allergic reactions, rashes  PHYSICAL EXAM: Filed Vitals:   01/19/16 0929  BP: 120/76  Pulse: 88   General: No acute distress.  Patient appears well-groomed.  normal body habitus. Head:  Normocephalic/atraumatic Eyes:  Fundoscopic exam unremarkable without vessel changes, exudates, hemorrhages or papilledema. Neck: supple, no paraspinal tenderness, full range of motion Heart:  Regular rate and rhythm Lungs:  Clear to auscultation bilaterally Back: No paraspinal tenderness Neurological Exam: alert and oriented to person, place, and time. Attention span and concentration intact, recent and remote memory intact, fund of knowledge intact.  Speech fluent and not dysarthric, language intact.  CN II-XII intact. Fundoscopic exam unremarkable without vessel changes, exudates, hemorrhages or papilledema.  Bulk and tone normal, muscle strength 5/5 throughout.  Sensation to light touch, temperature and vibration intact.  Deep tendon reflexes 2+ throughout, toes downgoing.  Finger to nose and heel to shin testing intact.  Gait normal, Romberg negative.  IMPRESSION: Migraine without aura  PLAN: 1.  Continue amitriptyline 10mg  at bedtime.  Continue supplements since you are doing well. 2.  Instead of sumatriptan, will try rizatriptan 10mg .  May take each dose with naproxen 500mg  3.  Try keeping same sleep schedule on Sundays 4.  Follow up in 5 months.  15 minutes spent face to face with patient, over 50% spent discussing  management.  Shon MilletAdam Srah Ake, DO

## 2016-01-20 ENCOUNTER — Other Ambulatory Visit: Payer: Self-pay

## 2016-01-20 NOTE — Telephone Encounter (Signed)
Pt called because Rizatriptan was too expensive. Pt does not have insurance.  Pt was given this info:  Fax was sent back to pharmacy asking if they knew of cheaper alternative for pt.   Www.pparx.org  This private and confidential program provides medicine free of charge to eligible individuals, primarily the uninsured who, without our assistance, could not afford needed [employer] medicines. Individuals who don't meet the insurance criteria may still qualify for the [employer] Patient Assistance Program if they attest that they have special circumstances of financial and medical hardship, and their income meets the program criteria. A single application may provide for up to 1 year of medicine free of charge to eligible individuals and an individual may reapply as many times as needed. Contact Information  If you have any questions about the [employer] Patient Assistance Program including the status of an application, please call [phone], 8 AM to 8 PM EST, Monday through Friday.

## 2016-03-07 ENCOUNTER — Telehealth: Payer: Self-pay | Admitting: Neurology

## 2016-03-07 MED ORDER — AMITRIPTYLINE HCL 10 MG PO TABS: ORAL_TABLET | ORAL | Status: DC

## 2016-03-07 NOTE — Telephone Encounter (Signed)
I spoke with patients friend/Yolanda. Patient c/o medications not working. She has been taking amitriptyline & naproxen, patient states that meds only ease the pain a "little" bit. She is concerned that something else is wrong. Patient also states the Maxalt also didn't help.

## 2016-03-07 NOTE — Telephone Encounter (Signed)
Please have her increase amitriptyline to 20mg  at bedtime, since she is on a very low dose of the medication.  She can consider sooner f/u with Dr. Everlena CooperJaffe to discuss further.

## 2016-03-07 NOTE — Telephone Encounter (Signed)
Called and spoke with Diane Vaughn again and notified her of advisement of med change for patient. She verbalized good understanding. Will send in new Rx to patients pharmacy to reflect change. Advised for patient to let us know if increase is still of no help.

## 2016-03-07 NOTE — Telephone Encounter (Signed)
Diane MurdochMonica Vaughn March 15, 1981.  She would like you to please cal her at [phone]. This is her friends number. She does not speak english. She said that her Migraine medication is not helping her. She is concerned that something else may be wrong. Thank you

## 2016-03-14 ENCOUNTER — Encounter (HOSPITAL_COMMUNITY): Payer: Self-pay

## 2016-03-14 ENCOUNTER — Emergency Department (HOSPITAL_COMMUNITY)
Admission: EM | Admit: 2016-03-14 | Discharge: 2016-03-14 | Disposition: A | Discharge status: Home / Self Care | Payer: Self-pay | Attending: Emergency Medicine | Admitting: Emergency Medicine

## 2016-03-14 DIAGNOSIS — G43009 Migraine without aura, not intractable, without status migrainosus: Secondary | ICD-10-CM

## 2016-03-14 DIAGNOSIS — G43909 Migraine, unspecified, not intractable, without status migrainosus: Secondary | ICD-10-CM | POA: Insufficient documentation

## 2016-03-14 DIAGNOSIS — Z79899 Other long term (current) drug therapy: Secondary | ICD-10-CM | POA: Insufficient documentation

## 2016-03-14 HISTORY — DX: Migraine, unspecified, not intractable, without status migrainosus: G43.909

## 2016-03-14 MED ORDER — DIPHENHYDRAMINE HCL 50 MG/ML IJ SOLN
25.0000 mg | Freq: Once | INTRAMUSCULAR | Status: AC
  Administered 2016-03-14: 25 mg via INTRAMUSCULAR
  Filled 2016-03-14: qty 1

## 2016-03-14 MED ORDER — METOCLOPRAMIDE HCL 5 MG/ML IJ SOLN
10.0000 mg | Freq: Once | INTRAMUSCULAR | Status: AC
  Administered 2016-03-14: 10 mg via INTRAMUSCULAR
  Filled 2016-03-14: qty 2

## 2016-03-14 MED ORDER — DEXAMETHASONE SODIUM PHOSPHATE 10 MG/ML IJ SOLN
10.0000 mg | Freq: Once | INTRAMUSCULAR | Status: AC
  Administered 2016-03-14: 10 mg via INTRAMUSCULAR
  Filled 2016-03-14: qty 1

## 2016-03-14 NOTE — ED Provider Notes (Signed)
CSN: [id number]     Arrival date & time 03/14/16  1840 History   First MD Initiated Contact with Patient 03/14/16 2224     Chief Complaint  Patient presents with  . Headache     (Consider location/radiation/quality/duration/timing/severity/associated sxs/prior Treatment) HPI Comments: Patient presents with a headache. She has a history of migraines. She states that she's had a right-sided headache over the last week. Been gradually worsening over that time.  She has associated nausea and vomiting. She has a history of headaches and states that this is a same type of pain that she normally has with her migrainous type headaches. She denies any fevers. No neck stiffness. She's been using Maxalt at home without improvement in symptoms. She denies any unusual symptoms as compared to her typical migraines. History was obtained through the Spanish interpreter line.  Patient is a 35 y.o. female presenting with headaches.  Headache Associated symptoms: nausea and vomiting   Associated symptoms: no abdominal pain, no back pain, no congestion, no cough, no diarrhea, no dizziness, no fatigue, no fever, no neck pain, no numbness and no weakness     Past Medical History  Diagnosis Date  . Migraine    History reviewed. No pertinent past surgical history. No family history on file. Social History  Substance Use Topics  . Smoking status: Never Smoker   . Smokeless tobacco: Never Used  . Alcohol Use: No   OB History    Gravida Para Term Preterm AB TAB SAB Ectopic Multiple Living   Review of Systems  Constitutional: Negative for fever, chills, diaphoresis and fatigue.  HENT: Negative for congestion, rhinorrhea and sneezing.   Eyes: Negative.   Respiratory: Negative for cough, chest tightness and shortness of breath.   Cardiovascular: Negative for chest pain and leg swelling.  Gastrointestinal: Positive for nausea and vomiting. Negative for abdominal pain, diarrhea and blood in  stool.  Genitourinary: Negative for frequency, hematuria, flank pain and difficulty urinating.  Musculoskeletal: Negative for back pain, arthralgias and neck pain.  Skin: Negative for rash.  Neurological: Positive for headaches. Negative for dizziness, speech difficulty, weakness and numbness.      Allergies  Review of patient's allergies indicates no known allergies.  Home Medications   Prior to Admission medications   Medication Sig Start Date End Date Taking? Authorizing Provider  amitriptyline (ELAVIL) 10 MG tablet Take 2 tablets at bedtime 03/07/16   Donika K Patel, DO  magnesium citrate SOLN Take 1 Bottle by mouth once.    Historical Provider, MD  naproxen (NAPROSYN) 500 MG tablet Take 1 tablet (500 mg total) by mouth every 12 (twelve) hours as needed. 10/12/15   Drema Dallas, DO  Norgestimate-Ethinyl Estradiol Triphasic 0.18/0.215/0.25 MG-35 MCG tablet Take 1 tablet by mouth daily. 02/27/13   Adam Phenix, MD  Riboflavin (B-2 PO) Take by mouth.    Historical Provider, MD  rizatriptan (MAXALT) 10 MG tablet Take 1tab at earliest onset of headache.  May repeat x1 in 2 hours if needed.  Do not exceed 2 tabs in 24 hours 01/19/16   Drema Dallas, DO   BP 114/85 mmHg  Pulse 88  Temp(Src) 98.6 F (37 C) (Oral)  Resp 18  SpO2 100%  LMP 03/05/2016 Physical Exam  Constitutional: She is oriented to person, place, and time. She appears well-developed and well-nourished.  HENT:  Head: Normocephalic and atraumatic.  Eyes: Pupils are equal, round,  and reactive to light.  No photophobia  Neck: Normal range of motion. Neck supple.  No meningismus  Cardiovascular: Normal rate, regular rhythm and normal heart sounds.   Pulmonary/Chest: Effort normal and breath sounds normal. No respiratory distress. She has no wheezes. She has no rales. She exhibits no tenderness.  Abdominal: Soft. Bowel sounds are normal. There is no tenderness. There is no rebound and no guarding.  Musculoskeletal: Normal  range of motion. She exhibits no edema.  Lymphadenopathy:    She has no cervical adenopathy.  Neurological: She is alert and oriented to person, place, and time. She has normal strength. No cranial nerve deficit or sensory deficit. GCS eye subscore is 4. GCS verbal subscore is 5. GCS motor subscore is 6.  Skin: Skin is warm and dry. No rash noted.  Psychiatric: She has a normal mood and affect.    ED Course  Procedures (including critical care time) Labs Review Labs Reviewed - No data to display  Imaging Review No results found. I have personally reviewed and evaluated these images and lab results as part of my medical decision-making.   EKG Interpretation None      MDM   Final diagnoses:  Nonintractable migraine, unspecified migraine type    Patient presents with a migraine. She has no atypical symptoms that would be more suggestive of subarachnoid hemorrhage or meningitis. She was given a migraine cocktail and feels much better. She was discharged home in good condition. She is followed by low power neurology for her migraines. I encouraged her to follow-up with her neurologist if her symptoms are not improving or return here as needed for any worsening symptoms.    Rolan BuccoMelanie Sue Fernicola, MD 03/14/16 [phone]

## 2016-03-14 NOTE — Discharge Instructions (Signed)
Cefalea migrañosa  (Migraine Headache)  Una cefalea migrañosa es un dolor intenso y punzante en uno o ambos lados de la cabeza. La migraña puede durar desde 30 minutos hasta varias horas.  CAUSAS   No siempre se conoce la causa exacta de la cefalea migrañosa. Sin embargo, pueden aparecer cuando los nervios del cerebro se irritan y liberan ciertas sustancias químicas que causan inflamación. Esto ocasiona dolor.  Existen también ciertos factores que pueden desencadenar las migrañas, como los siguientes:  · Alcohol.  · Fumar.  · Estrés.  · La menstruación  · Quesos añejados.  · Los alimentos o las bebidas que contienen nitratos, glutamato, aspartamo o tiramina.  · Falta de sueño.  · Chocolate.  · Cafeína.  · Hambre.  · Actividad física extenuante.  · Fatiga.  · Medicamentos que se usan para tratar el dolor en el pecho (nitroglicerina), píldoras anticonceptivas, estrógeno y algunos medicamentos para la hipertensión arterial.  SIGNOS Y SÍNTOMAS  · Dolor en uno o ambos lados de la cabeza.  · Dolor pulsante o punzante.  · Dolor intenso que impide realizar las actividades diarias.  · Dolor que se agrava por cualquier actividad física.  · Náuseas, vómitos o ambos.  · Mareos.  · Dolor con la exposición a las luces brillantes, a los ruidos fuertes o la actividad.  · Sensibilidad general a las luces brillantes, a los ruidos fuertes o a los olores.  Antes de sufrir una migraña, puede recibir señales de advertencia (aura). Un aura puede incluir:  · Ver las luces intermitentes.  · Ver puntos brillantes, halos o líneas en zigzag.  · Tener una visión en túnel o visión borrosa.  · Sensación de entumecimiento u hormigueo.  · Dificultad para hablar  · Debilidad muscular.  DIAGNÓSTICO   La cefalea migrañosa se diagnostica en función de lo siguiente:  · Síntomas.  · Examen físico.  · Una TC (tomografía computada) o resonancia magnética de la cabeza. Estas pruebas de diagnóstico por imagen no pueden diagnosticar las migrañas, pero pueden  ayudar a descartar otras causas de las cefaleas.  TRATAMIENTO  Le prescribirán medicamentos para aliviar el dolor y las náuseas. También podrán administrarse medicamentos para ayudar a prevenir las migrañas recurrentes.   INSTRUCCIONES PARA EL CUIDADO EN EL HOGAR  · Sólo tome medicamentos de venta libre o recetados para calmar el dolor o el malestar, según las indicaciones de su médico. No se recomienda usar los opiáceos a largo plazo.  · Cuando tenga la migraña, acuéstese en un cuarto oscuro y tranquilo  · Lleve un registro diario para averiguar lo que puede provocar las cefaleas migrañosas. Por ejemplo, escriba:    Lo que usted come y bebe.    Cuánto tiempo duerme.    Algún cambio en su dieta o en los medicamentos.  · Limite el consumo de bebidas alcohólicas.  · Si fuma, deje de hacerlo.  · Duerma entre 7 y 9 horas, o según las recomendaciones del médico.  · Limite el estrés.  · Mantenga las luces tenues si le molestan las luces brillantes y la migraña empeora.  SOLICITE ATENCIÓN MÉDICA DE INMEDIATO SI:   · La migraña se hace cada vez más intensa.  · Tiene fiebre.  · Presenta rigidez en el cuello.  · Tiene pérdida de visión.  · Presenta debilidad muscular o pérdida del control muscular.  · Comienza a perder el equilibrio o tiene problemas para caminar.  · Sufre mareos o se desmaya.  · Tiene síntomas graves que son diferentes   a los primeros síntomas.  ASEGÚRESE DE QUE:   · Comprende estas instrucciones.  · Controlará su afección.  · Recibirá ayuda de inmediato si no mejora o si empeora.     Esta información no tiene como fin reemplazar el consejo del médico. Asegúrese de hacerle al médico cualquier pregunta que tenga.     Document Released: 09/25/2005 Document Revised: 07/16/2013  Elsevier Interactive Patient Education ©2016 [employer]

## 2016-03-14 NOTE — ED Notes (Signed)
Patient complains of right sided headache x 1 week, history of migraines and states feels the same as her regular headaches. Nausea with same. Denies trauma. Alert and oriented

## 2016-06-20 ENCOUNTER — Ambulatory Visit: Payer: Self-pay | Admitting: Neurology

## 2016-07-04 ENCOUNTER — Ambulatory Visit (INDEPENDENT_AMBULATORY_CARE_PROVIDER_SITE_OTHER): Payer: Self-pay | Admitting: Internal Medicine

## 2016-07-04 ENCOUNTER — Encounter: Payer: Self-pay | Admitting: Internal Medicine

## 2016-07-04 VITALS — BP 108/60 | HR 90 | Temp 98.2°F | Resp 18 | Ht <= 58 in | Wt 109.0 lb

## 2016-07-04 DIAGNOSIS — G43909 Migraine, unspecified, not intractable, without status migrainosus: Secondary | ICD-10-CM | POA: Insufficient documentation

## 2016-07-04 DIAGNOSIS — H547 Unspecified visual loss: Secondary | ICD-10-CM

## 2016-07-04 DIAGNOSIS — K029 Dental caries, unspecified: Secondary | ICD-10-CM

## 2016-07-04 DIAGNOSIS — G43109 Migraine with aura, not intractable, without status migrainosus: Secondary | ICD-10-CM

## 2016-07-04 MED ORDER — VERAPAMIL HCL ER 120 MG PO TBCR: 120.0000 mg | EXTENDED_RELEASE_TABLET | Freq: Every day | ORAL | 11 refills | Status: DC

## 2016-07-04 NOTE — Patient Instructions (Signed)
Recommend calling PHD for appt. For IUD placement.  Call and let us know if this is not possible for any reason

## 2016-07-04 NOTE — Progress Notes (Signed)
Subjective:    Patient ID: Diane Vaughn, female    DOB: 07-07-1981, 35 y.o.   MRN: [id number]  HPI  Here to establish  1.  Migraines:  Diagnosed last year and previously followed by Dr. Everlena Cooper, Neurology.   She has not had brain imaging studies, which concerns her. Has had difficulties with infrquent headaches for at least 10 years. States these headaches became more frequent 2 years ago. Describes yawning frequently with her right eye watering before she develops a headache.  No visual symptoms before the headache starts. Headaches ensues about 10 minutes later. Headaches starts around her right eye.   Describes a pressure around her right frontal temporal area and then extends down her neck and into her shoulder.  Denies throbbing pain. +Photophobia, no real phonophobia.  + N and V with headache.   Has Naproxen--no longer helping. Does take Rizatriptan 10 mg and helps, but one dose dose not completely relieve.  She does not repeat the dose, as she feels it makes her head feel like a balloon and makes her too sleepy so she cannot keep an eye on her 30 and 25 year old children. States she is having 3 episodes of headaches per month and can have a headache lasting 3-4 days or more.  She agrees she likely has a headache 10-12 days out of the month. Patient currently taking 0.4 mg Norethindrone-35 mcg Ethinyl estradiol for past 6 months and her headaches are more frequent and prolonged.  Was taking Triphasic before, but the samples ran out at her OB/GYN, [facility], so made the switch. Had increased headaches with previous OCP, Triphasic, but was better with Triphasic than current OCP.  Had headaches initiating Depo provera, but improved with time. Has never had an IUD.   Plans possibly to have more children.  2.  Wears glasses.  Last Rx at [facility]    Current Meds  Medication Sig  . Coenzyme Q10 (COQ-10) 100 MG CAPS Take 2 capsules by mouth daily.  . magnesium citrate SOLN  Take 1 Bottle by mouth once.  . naproxen (NAPROSYN) 500 MG tablet Take 1 tablet (500 mg total) by mouth every 12 (twelve) hours as needed.  . norethindrone-ethinyl estradiol (VYFEMLA) 0.4-35 MG-MCG tablet Take 1 tablet by mouth daily.  . Riboflavin (B-2 PO) Take by mouth.  . rizatriptan (MAXALT) 10 MG tablet Take 1tab at earliest onset of headache.  May repeat x1 in 2 hours if needed.  Do not exceed 2 tabs in 24 hours   No Known Allergies   Past Medical History:  Diagnosis Date  . Migraine    No past surgical history on file.   Family History  Problem Relation Age of Onset  . Hypertension Mother   . Migraines Mother   . Epilepsy Sister   . Autism Son    Social History   Social History  . Marital status: Significant Other    Spouse name: Antonio  . Number of children: 2  . Years of education: 9   Occupational History  . [employer]-kitchen    Social History Main Topics  . Smoking status: Never Smoker  . Smokeless tobacco: Never Used  . Alcohol use No  . Drug use: No  . Sexual activity: Yes    Birth control/ protection: None, Pill   Other Topics Concern  . Not on file   Social History Narrative   Originally from [place]   Came to [place] In 2003   Lives at home with  long term boyfriend, father of her children, and her two children        Review of Systems     Objective:   Physical Exam NAD HEENT:  PERRL, EOMI, TMs pearly gray, throat without injection, dental decay Neck:  Supple, No adenopathy, no thyromegaly Chest: CTA CV:  RRR with normal S1 and S2, No S3, S4 or murmur. Radial and DP pulses normal and equal Abd:  S, NT, No HSM or mass. + BS Neuro:  A & O x 3, CN II-XII grossly intact, DTRs 2+/4 throughout, Motor 5/5 throughout, sensory grossly normal.  Normal Gait.       Assessment & Plan:  1.  Migraines:  Reportedly worsened by use of OCPs in general, particularly worse with current OCP.  Encouraged patient to consider switch to IUD--to call PHD for  appt. To do so. Start Verapamil SR 120 once daily at bedtime.  Discussed to stop and call if light headed or if does not feel well with medication.   Will check on other Triptan patient can [facility].  2.  Decreased visual acuity:  Optometry referral with voucher for glasses.  3.  Dental decay:  Dental referral.

## 2016-07-06 ENCOUNTER — Telehealth: Payer: Self-pay | Admitting: Licensed Clinical Social Worker

## 2016-07-06 NOTE — Telephone Encounter (Signed)
SWI attempted to contact new patient to follow up on services offered by the [facility].

## 2016-07-11 ENCOUNTER — Ambulatory Visit (INDEPENDENT_AMBULATORY_CARE_PROVIDER_SITE_OTHER): Payer: Self-pay

## 2016-07-11 VITALS — BP 108/70 | HR 64

## 2016-07-11 DIAGNOSIS — G43109 Migraine with aura, not intractable, without status migrainosus: Secondary | ICD-10-CM

## 2016-09-04 ENCOUNTER — Encounter: Payer: Self-pay | Admitting: Internal Medicine

## 2016-09-04 ENCOUNTER — Ambulatory Visit (INDEPENDENT_AMBULATORY_CARE_PROVIDER_SITE_OTHER): Payer: Self-pay | Admitting: Internal Medicine

## 2016-09-04 VITALS — BP 110/70 | HR 70 | Resp 12 | Ht <= 58 in | Wt 111.0 lb

## 2016-09-04 DIAGNOSIS — G43109 Migraine with aura, not intractable, without status migrainosus: Secondary | ICD-10-CM

## 2016-09-04 DIAGNOSIS — Z23 Encounter for immunization: Secondary | ICD-10-CM

## 2016-09-04 DIAGNOSIS — B351 Tinea unguium: Secondary | ICD-10-CM | POA: Insufficient documentation

## 2016-09-04 DIAGNOSIS — Z79899 Other long term (current) drug therapy: Secondary | ICD-10-CM

## 2016-09-04 MED ORDER — TERBINAFINE HCL 250 MG PO TABS: 250.0000 mg | ORAL_TABLET | Freq: Every day | ORAL | 0 refills | Status: DC

## 2016-09-04 NOTE — Patient Instructions (Signed)
For foot and toenail fungus:  Spray your shoes with Lysol or Lotrimin Antifungal spray when you start the medication for your feet.   Re-spray your the shoes you wear with each wearing and allow to dry before wearing again You will need to continue to spray the shoes you have during the infection of your toenails and feet have been thrown out due to wear.  Once your toenails and feet are clear of infection, the shoes you buy new do not necessarily need to be sprayed. Clean your shower floor once to twice daily with bleach containing cleaner. 

## 2016-09-04 NOTE — Progress Notes (Signed)
   Subjective:    Patient ID: Diane Vaughn, female    DOB: 26-Jan-1981, 35 y.o.   MRN: [id number]  HPI  1.  Migraines:  States not having headaches much less frequently--only 2 in past 2 months since starting Verapamil.  She does note if she sleeps late on a Sunday, that she is more prone to a headache.   Stopped her BCPs last month and now on Depoprovera as well.  Previously had worse headaches when starting depo, but improved with time.  2.  Onychomycosis of mainly fingernails and one great toenail: Noted fingernails on exam.  Has had for years.  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  Changes gloves frequently .  Has never been treated.  Current Meds  Medication Sig  . medroxyPROGESTERone (DEPO-PROVERA) 150 MG/ML injection Inject into the muscle every 3 (three) months.  . Multiple Vitamin (MULTIVITAMIN) tablet Take 1 tablet by mouth daily.  . naproxen (NAPROSYN) 500 MG tablet Take 1 tablet (500 mg total) by mouth every 12 (twelve) hours as needed.  . rizatriptan (MAXALT) 10 MG tablet Take 1tab at earliest onset of headache.  May repeat x1 in 2 hours if needed.  Do not exceed 2 tabs in 24 hours  . verapamil (CALAN-SR) 120 MG CR tablet Take 1 tablet (120 mg total) by mouth at bedtime.   No Known Allergies     Review of Systems     Objective:   Physical Exam NAD  Neuro exam:  Unchanged and normal Lungs;  CTA CV:  RRR without murmur or rub, radial pulses normal and equal. Fingernails with thickening and yellow/brown discoloration, flaking.  Periungual skin red flaking and mildly swollen. Left great toenail with lateral edges similarly involved.       Assessment & Plan:  1.  Migraines:  Improved on Verapamil and possibly with switch from OCP to Depoprovera.   Sounds like her headaches were more prominent during first two weeks o feach depo injection,no t just at beginning of use. Will see if her migraines significantly decrease with time  with Verapamil and Depo.   Consider D/C of Verapamil if she can be controlled with switch to depo in next 6 months-- sounds unlikely with history.  2.  Onychomycosis, mainly of fingernails. Terbinafine daily for 90 days.  Information given for shoe and shower floor treatment to prevent recurrence. CMP for baseline liver enzymes. Follow up 6 weeks for hepatic profile and recheck of nails.  3.  HM:  Tdap and influenza

## 2016-09-05 LAB — COMPREHENSIVE METABOLIC PANEL
ALK PHOS: 49 IU/L (ref 39–117)
ALT: 6 IU/L (ref 0–32)
AST: 17 IU/L (ref 0–40)
Albumin/Globulin Ratio: 1.6 (ref 1.2–2.2)
Albumin: 4.6 g/dL (ref 3.5–5.5)
BUN/Creatinine Ratio: 23 (ref 9–23)
BUN: 12 mg/dL (ref 6–20)
Bilirubin Total: 0.5 mg/dL (ref 0.0–1.2)
CALCIUM: 9 mg/dL (ref 8.7–10.2)
CO2: 22 mmol/L (ref 18–29)
CREATININE: 0.53 mg/dL — AB (ref 0.57–1.00)
Chloride: 103 mmol/L (ref 96–106)
GFR calc Af Amer: 143 mL/min/{1.73_m2} (ref >59)
GFR calc non Af Amer: 124 mL/min/{1.73_m2} (ref >59)
GLUCOSE: 80 mg/dL (ref 65–99)
Globulin, Total: 2.8 g/dL (ref 1.5–4.5)
Potassium: 4.3 mmol/L (ref 3.5–5.2)
Sodium: 142 mmol/L (ref 134–144)
Total Protein: 7.4 g/dL (ref 6.0–8.5)

## 2016-10-17 ENCOUNTER — Telehealth: Payer: Self-pay | Admitting: Neurology

## 2016-10-17 NOTE — Telephone Encounter (Signed)
If she has a driver, she can come in for a headache cocktail (Toradol 60mg Benito Mccreedy/Benadryl 25mg /Reglan 10mg ).  If she doesn't have a driver, then she can come in for a Toradol 60mg  injection alone.

## 2016-10-17 NOTE — Telephone Encounter (Signed)
Diane Vaughn 1981-03-16. Her # [phone]. She has had a headache for a few days now. She would like you to call her about coming in for an injection. Thank you

## 2016-10-17 NOTE — Telephone Encounter (Signed)
Patient states she is doing much better now, since taking medication. Will c/b to schedule a f/u w/ Dr. Everlena CooperJaffe soon.

## 2016-10-27 ENCOUNTER — Ambulatory Visit: Payer: Self-pay | Admitting: Gynecology

## 2016-10-27 DIAGNOSIS — Z0289 Encounter for other administrative examinations: Secondary | ICD-10-CM

## 2016-10-30 ENCOUNTER — Encounter: Payer: Self-pay | Admitting: Internal Medicine

## 2016-10-30 ENCOUNTER — Ambulatory Visit (INDEPENDENT_AMBULATORY_CARE_PROVIDER_SITE_OTHER): Payer: Self-pay | Admitting: Internal Medicine

## 2016-10-30 VITALS — BP 112/72 | HR 72 | Resp 12 | Ht <= 58 in | Wt 113.0 lb

## 2016-10-30 DIAGNOSIS — Z79899 Other long term (current) drug therapy: Secondary | ICD-10-CM

## 2016-10-30 DIAGNOSIS — B351 Tinea unguium: Secondary | ICD-10-CM

## 2016-10-30 DIAGNOSIS — Z30011 Encounter for initial prescription of contraceptive pills: Secondary | ICD-10-CM

## 2016-10-30 MED ORDER — NORGESTIMATE-ETH ESTRADIOL 0.25-35 MG-MCG PO TABS: 1.0000 | ORAL_TABLET | Freq: Every day | ORAL | 9 refills | Status: DC

## 2016-10-30 NOTE — Patient Instructions (Addendum)
For foot and toenail fungus:  Spray your shoes with Lysol or Lotrimin Antifungal spray when you start the medication for your feet.   Re-spray your the shoes you wear with each wearing and allow to dry before wearing again You will need to continue to spray the shoes you have during the infection of your toenails and feet have been thrown out due to wear.  Once your toenails and feet are clear of infection, the shoes you buy new do not necessarily need to be sprayed. Clean your shower floor once to twice daily with bleach containing cleaner.  Your periods may not be regular for the first 3-4 months, but call if your cycles are really a problem  Use a condom for the first month of pill use.

## 2016-10-30 NOTE — Progress Notes (Signed)
   Subjective:    Patient ID: Diane Vaughn, female    DOB: December 24, 1980, 36 y.o.   MRN: [id number]  HPI   1.  Onychomycosis:  Feels her fingernails are definitely better.  Right hand fingernails mainly involved as well as left thumbnail and greater toenails bilaterally. Is not spraying shoes or cleaning shower floor as recommended  2.  Family planning:  Last Depo Provera was given 07/26/2016.  Was supposed to have it last week.  Last intercourse was on January 14th.  Was prescribed morning after pill about 1 week ago as [facility] felt she was not covered with the Depo.  No intercourse since then.  She has been unable to get in due to the weather last week. Has been on BCPs previously and tolerated well and good with memory to take. Willing to try Sprintec, which she [facility] $10 per month. Last pap was in October 2017. She is unaware of any problem. No family or personal history of clotting/DVT No tobacco use.  Current Meds  Medication Sig  . Multiple Vitamin (MULTIVITAMIN) tablet Take 1 tablet by mouth daily.  . naproxen (NAPROSYN) 500 MG tablet Take 1 tablet (500 mg total) by mouth every 12 (twelve) hours as needed.  . rizatriptan (MAXALT) 10 MG tablet Take 1tab at earliest onset of headache.  May repeat x1 in 2 hours if needed.  Do not exceed 2 tabs in 24 hours  . terbinafine (LAMISIL) 250 MG tablet Take 1 tablet (250 mg total) by mouth daily.  . verapamil (CALAN-SR) 120 MG CR tablet Take 1 tablet (120 mg total) by mouth at bedtime.    No Known Allergies       Review of Systems     Objective:   Physical Exam  Hands and Feet:  Fingernails with thickening and orange yellow discoloration along sides of nails 4/5 right nails--all with mild inflammation of skin at base of nail and lack of cuticle and 1 of 5 nails on left,  Great toenails involved mildly  Abd: S, NT, No HSM or mass, + BS        Assessment & Plan:  1.  Fingernail and Toenail  onychomycosis:  Has half of course to go.  Asked patient to massage Terbinafine cream into the nail base of fingernails involved twice daily. Hepatic profile today. Follow up in 5 weeks to see whether adequately clearing before completes treatment.   To spray shoes and clean shower floor regularly as discussed previously.  2.  Family Planning:  Initiating Sprintec,  To call if does not tolerate.  Check urine HCG with recent history of intercourse with morning after pill.

## 2016-10-31 LAB — HEPATIC FUNCTION PANEL
ALBUMIN: 5 g/dL (ref 3.5–5.5)
ALT: 6 IU/L (ref 0–32)
AST: 18 IU/L (ref 0–40)
Alkaline Phosphatase: 49 IU/L (ref 39–117)
BILIRUBIN, DIRECT: 0.07 mg/dL (ref 0.00–0.40)
Total Protein: 7.9 g/dL (ref 6.0–8.5)

## 2016-10-31 NOTE — Progress Notes (Signed)
Left message for patient to call the office

## 2016-11-01 NOTE — Progress Notes (Signed)
Spoke with patient. Lab results given.

## 2016-11-03 LAB — POCT URINE PREGNANCY: PREG TEST UR: NEGATIVE

## 2016-12-11 ENCOUNTER — Ambulatory Visit: Payer: Self-pay | Admitting: Internal Medicine

## 2017-02-05 ENCOUNTER — Telehealth: Payer: Self-pay | Admitting: Neurology

## 2017-02-05 NOTE — Telephone Encounter (Signed)
Has not been seen in over a year. Please advise if this is okay to write?

## 2017-02-05 NOTE — Telephone Encounter (Signed)
We can provide a note stating that she has migraines and the medications that she takes for them, but we cannot provide more specifics than that, since I haven't seen her in over a year.  She must make a follow up appointment for further refills.

## 2017-02-05 NOTE — Telephone Encounter (Signed)
Letter written. Patient made aware. Will pick up today.

## 2017-02-05 NOTE — Telephone Encounter (Signed)
Patient stopped by the office today requesting a letter from Dr. Everlena Cooper for her work. She has Migraines and she needs them to know she has Migraines and the Medications that she is taking. Please call. Thanks

## 2017-02-21 ENCOUNTER — Encounter: Payer: Self-pay | Admitting: Gynecology

## 2017-08-08 NOTE — Progress Notes (Addendum)
 Subjective:   Patient ID: Diane Vaughn is a 36 y.o. female.       Flu Offering: Flu Offer Have you had your flu shot this season (August 2018-March 2019)?: No, I recommend that we give you a seasonal influenza vaccine today: patient declines, Reason for declination: not interested today   History  Smoking Status  . Never Smoker  Smokeless Tobacco  . Never Used   Past Medical History:  Diagnosis Date  . Migraine 2016   History reviewed. No pertinent surgical history. History reviewed. No pertinent family history.   Objective:  Physical Exam  Constitutional: She is oriented to person, place, and time. Vital signs are normal. She appears well-developed and well-nourished.  HENT:  Head: Normocephalic.  Eyes: Pupils are equal, round, and reactive to light.  Cardiovascular: Normal rate, regular rhythm and normal heart sounds.   Pulmonary/Chest: Effort normal and breath sounds normal.  Abdominal: Soft. Bowel sounds are normal. There is no tenderness. There is no CVA tenderness. No hernia.  Musculoskeletal: Normal range of motion.  Neurological: She is alert and oriented to person, place, and time. She has normal reflexes.  Skin: Skin is warm, dry and intact.  Psychiatric: She has a normal mood and affect. Her behavior is normal. Judgment and thought content normal.      1. Encounter for surveillance of contraceptive pills  - Contraceptive Care - POCT pregnancy, urine (Negative)  Patient was previous taking this medication, last refilled 07/28. Denies any S/E or contraindications.   - norgestimate -ethinyl estradiol  (ORTHO-CYCLEN, 28,) 0.25-35 mg-mcg tablet; Take 1 tablet by mouth daily.  Dispense: 28 tablet; Refill: 12   Assessment/Plan:    Counseling provided. Lifestyle modifications encouraged/reinforced. Treatment plan reviewed with patient in accordance with [facility] guidelines.

## 2017-08-14 NOTE — Telephone Encounter (Signed)
 Called by Sharyne Merry, NP  Call Made:Yes  Patient reached:No    Additional call back comments: Called patient L/M on voicemail for clarification of dispensed RX.

## 2017-11-05 NOTE — Progress Notes (Signed)
Pt called office, has had a headache for several days, wanted to come in for a headache coctail. Advsd her she has not been seen since 01/2016, she will need to contact PCP or go to an urgent care. Pt states she will call back to make appt.

## 2020-08-29 NOTE — Progress Notes (Signed)
 During this patient encounter, the patient was wearing a mask.  Throughout this encounter, I was wearing at least a surgical mask.  I was not within 6 feet of this patient for more than 15 minutes without eye protection when they were not wearing mask.   Subjective:    History was provided by the patient. Diane Vaughn is a 39 y.o. female who presents for evaluation of symptoms of a URI. Symptoms include nasal blockage, post nasal drip, sinus and nasal congestion and sore throat. Onset of symptoms was a few days ago, unchanged since that time. Associated symptoms include achiness and congestion.  She is drinking plenty of fluids. Evaluation to date: none. Treatment to date: cough suppressants and decongestants The following portions of the patient's history were reviewed and updated as appropriate: allergies, current medications, past family history, past medical history, past social history, past surgical history and problem list.  Review of Systems A complete ROS was performed with pertinent positives/negatives noted in the HPI. The remainder of the ROS are negative.   Objective:   BP 113/67   Pulse 70   Temp 97.9 F (36.6 C)   Ht 1.448 m (4' 9)   Wt 49.9 kg (110 lb)   LMP 08/28/2020   SpO2 99%   BMI 23.80 kg/m  General appearance: alert, appears stated age and cooperative Head: Normocephalic, without obvious abnormality, atraumatic Eyes: conjunctivae/corneas clear. PERRL, EOM's intact. Fundi benign. Ears: normal TM's and external ear canals both ears Nose: Nares normal. Septum midline. Mucosa normal. No drainage or sinus tenderness. Throat: lips, mucosa, and tongue normal; teeth and gums normal Neck: no adenopathy, no carotid bruit, no JVD, supple, symmetrical, trachea midline and thyroid not enlarged, symmetric, no tenderness/mass/nodules Lungs: clear to auscultation bilaterally Heart: regular rate and rhythm, S1, S2 normal, no murmur, click, rub or gallop Skin: Skin color,  texture, turgor normal. No rashes or lesions Neurologic: Grossly normal      POCT COVID19: Negative POCT Flu: Negative   Assessment:   viral upper respiratory illness   Plan:   Discussed diagnosis and treatment of URI. Discussed the importance of avoiding unnecessary antibiotic therapy. Nasal saline spray for congestion. Follow up as needed. Meds per orders   Electronically signed by: Glendia Curtistine Begun, PA-C 08/29/20 1617

## 2024-02-26 ENCOUNTER — Telehealth: Payer: Self-pay | Admitting: Internal Medicine

## 2024-02-26 NOTE — Telephone Encounter (Signed)
 [facility] has not opened up any slots for new patients.  Copied from CRM [id number]. Topic: Appointments - Scheduling Inquiry for Clinic >> Feb 26, 2024 12:46 PM Tisa Forester wrote: Reason for CRM: patient requesting to be establish care as a new patient with priscila, arellano , reason requesting for need a spanish speaking provider or if any other spanish speaking provider  patient call back number : [phone] (need spanish intrepreter ) >> Feb 26, 2024 12:48 PM Tisa Forester wrote: Patient requesting if can get a callback today reason she be at work tomorrow

## 2024-03-10 ENCOUNTER — Telehealth: Payer: Self-pay | Admitting: Internal Medicine

## 2024-03-10 NOTE — Telephone Encounter (Signed)
 Copied from CRM [id number]. Topic: Appointments - Scheduling Inquiry for Clinic >> Feb 26, 2024 12:46 PM Tisa Forester wrote: Reason for CRM: patient requesting to be establish care as a new patient with priscila, arellano , reason requesting for need a spanish speaking provider or if any other spanish speaking provider  patient call back number : [phone] (need spanish intrepreter ) >> Mar 07, 2024  8:19 AM Tisa Forester wrote: Asking if accepting new patient  >> Feb 26, 2024 12:48 PM Tisa Forester wrote: Patient requesting if can get a callback today reason she be at work tomorrow   Patient contact via telephone with [employer] [phone] Diane Vaughn.  Unable to reach patient via telephone, but did leave detailed message that our office is not accepting new patients.

## 2024-05-06 ENCOUNTER — Ambulatory Visit (INDEPENDENT_AMBULATORY_CARE_PROVIDER_SITE_OTHER): Payer: Self-pay | Admitting: Family Medicine

## 2024-05-06 ENCOUNTER — Encounter: Payer: Self-pay | Admitting: Family Medicine

## 2024-05-06 VITALS — BP 124/80 | HR 62 | Ht <= 58 in | Wt 112.2 lb

## 2024-05-06 DIAGNOSIS — G43819 Other migraine, intractable, without status migrainosus: Secondary | ICD-10-CM

## 2024-05-06 DIAGNOSIS — N926 Irregular menstruation, unspecified: Secondary | ICD-10-CM

## 2024-05-06 DIAGNOSIS — N939 Abnormal uterine and vaginal bleeding, unspecified: Secondary | ICD-10-CM

## 2024-05-06 DIAGNOSIS — R5383 Other fatigue: Secondary | ICD-10-CM

## 2024-05-06 MED ORDER — NAPROXEN 500 MG PO TABS: 500.0000 mg | ORAL_TABLET | Freq: Two times a day (BID) | ORAL | 1 refills | Status: DC | PRN

## 2024-05-06 MED ORDER — SUMATRIPTAN SUCCINATE 100 MG PO TABS: ORAL_TABLET | ORAL | 0 refills | Status: DC

## 2024-05-06 NOTE — Patient Instructions (Addendum)
 Please take Naproxen  500mg  one pill morning and evening when you are having heavy bleeding. If you're migraines don't get better you make take the sumatriptan . Please do not take more than 2 pills in a 24 h period.  VISIT SUMMARY:  Today, we discussed your irregular and heavy menstrual bleeding, fatigue, migraines, and oral dryness. We have outlined a plan to address each of these issues and ordered some tests to better understand your symptoms.  YOUR PLAN:  -ABNORMAL UTERINE BLEEDING: Abnormal uterine bleeding means experiencing irregular menstrual cycles with heavy and light flows, significant cramping, and clots. We will perform a vaginal ultrasound to evaluate your uterus and endometrium, and we have ordered blood tests to check for anemia. You should stop taking your current birth control pills due to their potential link to your migraines. We will discuss alternative birth control options if your irregular periods are confirmed.  -FATIGUE AND RESTLESS LEGS SYMPTOMS: Your fatigue and restless legs symptoms are likely due to blood loss from your abnormal uterine bleeding, which can lead to low hemoglobin and iron  levels. We recommend starting a daily multivitamin and iron  supplement (65 mg). We will reassess your iron  levels after we get your blood work results.  -MIGRAINE: Migraines are severe headaches that can cause unilateral eye pain and other symptoms. Your migraines have increased in frequency around your menstrual periods. We have prescribed a new medication to take at the onset of a migraine, with a second dose if needed after two hours, but no more than two pills per day. If this treatment is not effective, we may consider a preventative medication.  -ORAL DRYNESS (XEROSTOMIA): Xerostomia means experiencing significant dryness in your mouth. We discussed your increased intake of electrolytes and ruled out a family history of diabetes. We will continue to monitor this  symptom.  INSTRUCTIONS:  Please schedule a vaginal ultrasound and complete the blood tests (CBC, BMP, and iron  studies) as soon as possible. Follow up with us  after the tests are done to discuss the results and next steps. In the meantime, stop taking your current birth control pills and start the multivitamin and iron  supplement daily. Use the new migraine medication as directed and let us  know if it is not effective.

## 2024-05-06 NOTE — Progress Notes (Signed)
 Name: Diane Vaughn   Date of Visit: 05/06/24   Date of last visit with me: Visit date not found   CHIEF COMPLAINT:  Chief Complaint  Patient presents with   Establish Care    New patient.        HPI:  Discussed the use of AI scribe software for clinical note transcription with the patient, who gave verbal consent to proceed.  History of Present Illness   Diane Vaughn is a 43 year old female who presents with irregular and heavy menstrual bleeding.  She has experienced irregular menstrual cycles for the past six months, with alternating heavy and light flows. Her cycle includes a week of heavy bleeding, a week of no bleeding, and a week of light bleeding, repeating this pattern. She reports significant dysmenorrhea, described as 'like contractions', and notes blood clots during more painful episodes. Spotting occurs during the non-menstruating week.  She is sexually active and experiences dyspareunia and a sensation of inflammation during intercourse, but no postcoital bleeding. She consistently takes birth control without missing doses.  She has a history of migraines, which have increased in frequency with her menstrual irregularities. Migraines occur primarily at the onset of her period, characterized by unilateral eye pain radiating posteriorly. She previously discontinued migraine medication due to ineffectiveness and adverse effects.  She reports significant fatigue and xerostomia, for which she consumes electrolytes, feeling water is insufficient. She has developed a habit of chewing ice, which she finds relaxing. She reports feeling antsy and experiencing leg movements at night that disrupt her sleep. No rectal bleeding, but she reports bloating after eating.         OBJECTIVE:       05/06/2024   10:30 AM  Depression screen PHQ 2/9  Decreased Interest 0  Down, Depressed, Hopeless 0  PHQ - 2 Score 0     BP Readings from Last 3 Encounters:   05/06/24 124/80  10/30/16 112/72  09/04/16 110/70    BP 124/80   Pulse 62   Ht 4' 10 (1.473 m)   Wt 112 lb 3.2 oz (50.9 kg)   SpO2 97%   BMI 23.45 kg/m    Physical Exam Constitutional:      Appearance: Normal appearance.  Cardiovascular:     Rate and Rhythm: Normal rate and regular rhythm.  Abdominal:     General: Abdomen is flat. There is no distension.     Palpations: Abdomen is soft. There is no mass.     Tenderness: There is abdominal tenderness (generalized). There is no right CVA tenderness, left CVA tenderness, guarding or rebound.     Hernia: No hernia is present.  Neurological:     General: No focal deficit present.     Mental Status: She is alert and oriented to person, place, and time. Mental status is at baseline.     ASSESSMENT/PLAN:   Assessment & Plan Menstrual periods irregular  Vaginal bleeding  Other fatigue  Other migraine without status migrainosus, intractable    Assessment and Plan    Abnormal uterine bleeding Irregular cycles with heavy and light flow, significant cramping, and clots. Differential includes uterine polyps or irregular menstruation. Concern for anemia due to blood loss. - Order transvaginal ultrasound to evaluate  - Order CBC, BMP, and iron  studies to assess for anemia. - Advise to stop oral contraceptive pills due to migraines. - Discuss potential birth control adjustment if irregular periods confirmed.  Fatigue and restless legs symptoms Likely secondary to blood loss  from abnormal uterine bleeding, leading to low hemoglobin and iron  levels. - Advise to start multivitamin and iron  supplement (65 mg) daily. - Reassess iron  levels after blood work results.  Migraine Increased frequency around menstrual periods. Previous medications ineffective. Symptoms include unilateral eye pain. - Prescribe new abortive medication, instruct to take one pill at onset and a second two hours later if needed, max two per day. - Discuss  potential need for preventative medication if abortive treatment ineffective.       Octaviano Mukai A. Vita MD [facility]

## 2024-05-07 ENCOUNTER — Ambulatory Visit: Payer: Self-pay | Admitting: Family Medicine

## 2024-05-07 DIAGNOSIS — N939 Abnormal uterine and vaginal bleeding, unspecified: Secondary | ICD-10-CM

## 2024-05-07 LAB — IRON,TIBC AND FERRITIN PANEL
Ferritin: 9 ng/mL — AB (ref 15–150)
Iron Saturation: 10 — AB (ref 15–55)
Iron: 48 ug/dL (ref 27–159)
Total Iron Binding Capacity: 464 ug/dL — AB (ref 250–450)
UIBC: 416 ug/dL (ref 131–425)

## 2024-05-07 LAB — BASIC METABOLIC PANEL WITH GFR
BUN/Creatinine Ratio: 16 (ref 9–23)
BUN: 11 mg/dL (ref 6–24)
CO2: 19 mmol/L — ABNORMAL LOW (ref 20–29)
Calcium: 9.2 mg/dL (ref 8.7–10.2)
Chloride: 103 mmol/L (ref 96–106)
Creatinine, Ser: 0.67 mg/dL (ref 0.57–1.00)
Glucose: 103 mg/dL — ABNORMAL HIGH (ref 70–99)
Potassium: 4.1 mmol/L (ref 3.5–5.2)
Sodium: 135 mmol/L (ref 134–144)
eGFR: 112 mL/min/1.73 (ref >59)

## 2024-05-07 LAB — CBC WITH DIFFERENTIAL/PLATELET
Basophils Absolute: 0.1 x10E3/uL (ref 0.0–0.2)
Basos: 1 %
EOS (ABSOLUTE): 0.3 x10E3/uL (ref 0.0–0.4)
Eos: 4 %
Hematocrit: 35.4 % (ref 34.0–46.6)
Hemoglobin: 11.1 g/dL (ref 11.1–15.9)
Immature Grans (Abs): 0 x10E3/uL (ref 0.0–0.1)
Immature Granulocytes: 0 %
Lymphocytes Absolute: 1.9 x10E3/uL (ref 0.7–3.1)
Lymphs: 26 %
MCH: 27.6 pg (ref 26.6–33.0)
MCHC: 31.4 g/dL — ABNORMAL LOW (ref 31.5–35.7)
MCV: 88 fL (ref 79–97)
Monocytes Absolute: 0.7 x10E3/uL (ref 0.1–0.9)
Monocytes: 9 %
Neutrophils Absolute: 4.2 x10E3/uL (ref 1.4–7.0)
Neutrophils: 60 %
Platelets: 373 x10E3/uL (ref 150–450)
RBC: 4.02 x10E6/uL (ref 3.77–5.28)
RDW: 13.2 % (ref 11.7–15.4)
WBC: 7.2 x10E3/uL (ref 3.4–10.8)

## 2024-05-07 MED ORDER — IRON (FERROUS SULFATE) 325 (65 FE) MG PO TABS: 325.0000 mg | ORAL_TABLET | Freq: Every day | ORAL | 11 refills | Status: DC

## 2024-05-13 ENCOUNTER — Ambulatory Visit (HOSPITAL_COMMUNITY)
Admission: RE | Admit: 2024-05-13 | Discharge: 2024-05-13 | Disposition: A | Discharge status: Home / Self Care | Payer: Self-pay | Source: Ambulatory Visit | Attending: Family Medicine | Admitting: Family Medicine

## 2024-05-13 DIAGNOSIS — N939 Abnormal uterine and vaginal bleeding, unspecified: Secondary | ICD-10-CM | POA: Insufficient documentation

## 2024-05-13 DIAGNOSIS — N926 Irregular menstruation, unspecified: Secondary | ICD-10-CM | POA: Insufficient documentation

## 2024-06-10 ENCOUNTER — Encounter: Payer: Self-pay | Admitting: Family Medicine

## 2024-06-10 ENCOUNTER — Ambulatory Visit: Payer: Self-pay | Admitting: Family Medicine

## 2024-06-10 VITALS — BP 122/72 | HR 72 | Wt 114.8 lb

## 2024-06-10 DIAGNOSIS — N926 Irregular menstruation, unspecified: Secondary | ICD-10-CM

## 2024-06-10 DIAGNOSIS — N939 Abnormal uterine and vaginal bleeding, unspecified: Secondary | ICD-10-CM

## 2024-06-10 DIAGNOSIS — D251 Intramural leiomyoma of uterus: Secondary | ICD-10-CM

## 2024-06-10 MED ORDER — NAPROXEN 500 MG PO TABS: 500.0000 mg | ORAL_TABLET | Freq: Two times a day (BID) | ORAL | 1 refills | Status: DC | PRN

## 2024-06-10 NOTE — Progress Notes (Signed)
   Name: Diane Vaughn   Date of Visit: 06/10/24   Date of last visit with me: 05/06/2024   CHIEF COMPLAINT:  Chief Complaint  Patient presents with   Hospitalization Follow-up    1 month follow up . After ultrasound and labs         HPI:  Discussed the use of AI scribe software for clinical note transcription with the patient, who gave verbal consent to proceed.  History of Present Illness   Diane Vaughn is a 43 year old female with a large uterine fibroid who presents to review results and discuss symptoms.  She reports significant bleeding and pain. She has been told she has a large fibroid compressing the endometrium of her uterus. The bleeding occurred twice last month, with the first episode being very heavy and the second less severe. The pain persists despite these variations in bleeding.  She uses naproxen  for pain management, taking it only when the pain is severe. She does not take it daily but uses it as needed.  Additionally, she has been prescribed sumatriptan  for migraines, which she confirms is effective in managing her symptoms.         OBJECTIVE:       05/06/2024   10:30 AM  Depression screen PHQ 2/9  Decreased Interest 0  Down, Depressed, Hopeless 0  PHQ - 2 Score 0     BP Readings from Last 3 Encounters:  06/10/24 122/72  05/06/24 124/80  10/30/16 112/72    BP 122/72   Pulse 72   Wt 114 lb 12.8 oz (52.1 kg)   SpO2 98%   BMI 23.99 kg/m    Physical Exam          Physical Exam Constitutional:      Appearance: Normal appearance.  Neurological:     General: No focal deficit present.     Mental Status: She is alert and oriented to person, place, and time.     ASSESSMENT/PLAN:   Assessment & Plan Vaginal bleeding  Intramural uterine fibroid  Menstrual periods irregular    Assessment and Plan    Uterine fibroid causing abnormal uterine bleeding and pelvic pain Large intramural fibroid compressing endometrium,  causing bleeding and pain. Appears benign. - Refer to gynecologist for evaluation and surgical options, including myomectomy. - Advise naproxen  twice daily for one week during bleeding episodes. - Discuss financial assistance programs with gynecologist.  Migraine Sumatriptan  effective for symptom relief.         Diane Vaughn A. Vita MD [facility]

## 2024-06-11 NOTE — Progress Notes (Signed)
 43 y.o. G71P2002 female here for consult: AUB and fibroids. Significant Other. Presents with her daughter. Due to language barrier, an interpreter was present during the history-taking and subsequent discussion (and for part of the physical exam) with this patient. Works at [employer].  Patient's last menstrual period was 06/01/2024 (exact date). Period Duration (Days): 4 Period Pattern: Regular Menstrual Flow: Heavy Menstrual Control: Maxi pad Dysmenorrhea: (!) Severe Dysmenorrhea Symptoms: Headache  She reports abnormal bleeding, two cycles a month. HVB with clotsx 7d twice month. Symptoms have been present for 6 months.  She reports periods have always been heavy with clots and dysmenorrhea.  Started COC 51yr ago. COC was recently stopped by PCP because of ongoing bleeding. She has also tried Depo in the past. She has had migraine with the COC. Migraines have improved since stopping the pill. No bleeding today. She has daily pressure, and pain with urination. Last PAP ~59yr ago, no history of abnl PAPs.  She is currently sexually active.  Urine sample provided: Yes  05/06/2024 hemoglobin 11.1, platelets 373K, low iron  studies  05/13/2024 TVUS: Uterus   Measurements: 9.1 x 2.7 x 5.0 cm = volume: 65 mL. There is an 8.9 x 6.3 x 8.4 cm posterior body intramural fibroid with associated mass effect and displacement of the endometrium.   Endometrium   Thickness: 8 mm. The endometrium is poorly visualized and suboptimally evaluated.   Right ovary   Measurements: 2.2 x 1.4 x 2.7 cm = volume: 4.2 mL. Normal appearance/no adnexal mass.   Left ovary   Measurements: 3.2 x 1.7 x 3.3 cm = volume: 9.3 mL. There is a 2.3 cm dominant follicle or corpus luteum in the left ovary.   Other findings   No abnormal free fluid.   IMPRESSION: 1. Large posterior body intramural fibroid with mass effect and displacement of the endometrium. 2. Unremarkable ovaries.   Birth control: No Sexually  active: Yes   GYN HISTORY: No sig hx  OB History  Gravida Para Term Preterm AB Living  3 2 2   2   SAB IAB Ectopic Multiple Live Births      2    # Outcome Date GA Lbr Len/2nd Weight Sex Type Anes PTL Lv  3 Gravida              Birth Comments: System Generated. Please review and update pregnancy details.  2 Term      Vag-Spont   LIV  1 Term      Vag-Spont   LIV   Past Medical History:  Diagnosis Date   Migraine    History reviewed. No pertinent surgical history. Current Outpatient Medications on File Prior to Visit  Medication Sig Dispense Refill   Iron , Ferrous Sulfate , 325 (65 Fe) MG TABS Take 325 mg by mouth daily. 30 tablet 11   Multiple Vitamin (MULTI VITAMIN PO) Take by mouth daily.     naproxen  (NAPROSYN ) 500 MG tablet Take 1 tablet (500 mg total) by mouth 2 (two) times daily as needed. 60 tablet 1   SUMAtriptan  (IMITREX ) 100 MG tablet TAKE 1 TABLET (100 MG TOTAL) BY MOUTH AS NEEDED WHEN SYMPTOMS OCCUR. YOU MAY TAKE 1 MORE TABLET IN 2 HOURS IF HEADACHE PERSISTS OR RECURS. DO NOT TAKE MORE THAN 2 TABLETS IN A DAY (200MG ) 10 tablet 0   No current facility-administered medications on file prior to visit.   Allergies  Allergen Reactions   Latex     Other Reaction(s): Not available  PE Today's Vitals   06/12/24 1355  BP: 100/64  Pulse: 71  Temp: 98.2 F (36.8 C)  TempSrc: Oral  SpO2: 99%  Weight: 113 lb (51.3 kg)  Height: 4' 10.75 (1.492 m)   Body mass index is 23.02 kg/m.  Physical Exam Vitals reviewed. Exam conducted with a chaperone present.  Constitutional:      General: She is not in acute distress.    Appearance: Normal appearance.  HENT:     Head: Normocephalic and atraumatic.     Nose: Nose normal.  Eyes:     Extraocular Movements: Extraocular movements intact.     Conjunctiva/sclera: Conjunctivae normal.  Pulmonary:     Effort: Pulmonary effort is normal.  Genitourinary:    General: Normal vulva.     Exam position: Lithotomy position.      Vagina: Normal. No vaginal discharge.     Cervix: Normal. No cervical motion tenderness, discharge or lesion.     Uterus: Normal. Enlarged. Not tender.      Adnexa: Right adnexa normal and left adnexa normal.     Comments: Fullness noted along anterior CDS, clear posterior CDS Posterior cervix Musculoskeletal:        General: Normal range of motion.     Cervical back: Normal range of motion.  Neurological:     General: No focal deficit present.     Mental Status: She is alert.  Psychiatric:        Mood and Affect: Mood normal.        Behavior: Behavior normal.       Assessment and Plan:        Abnormal uterine bleeding (AUB) Assessment & Plan: Reviewed ultrasound revealing: 9cm posterior fibroid  Discussed pathology of fibroids and benign nature. Reviewed that management of fibroids is dependent upon associated symptoms. Asymptomatic fibroids can be managed expectantly. However, fibroids can cause AUB and bulk symptoms, including dysmenorrhea, pelvic and lower back pain and pressure, dyspareunia, and constipation.  Reviewed hormonal and nonhormonal management, including the Mirena IUD (declined). Also reviewed interventional procedures like COLOMBIA and surgical management including myomectomy and hysterectomy.  She has elected for POP. Considering Depo and hysterectomy (likely RATLH,BS), however she is self pay and will look into cost.  F/u as needed  Orders: -     Norethindrone ; Take 1 tablet (0.35 mg total) by mouth daily.  Dispense: 28 tablet; Refill: 11  Fibroid -     Norethindrone ; Take 1 tablet (0.35 mg total) by mouth daily.  Dispense: 28 tablet; Refill: 11    Vera LULLA Pa, MD

## 2024-06-12 ENCOUNTER — Encounter: Payer: Self-pay | Admitting: Obstetrics and Gynecology

## 2024-06-12 ENCOUNTER — Ambulatory Visit (INDEPENDENT_AMBULATORY_CARE_PROVIDER_SITE_OTHER): Payer: Self-pay | Admitting: Obstetrics and Gynecology

## 2024-06-12 VITALS — BP 100/64 | HR 71 | Temp 98.2°F | Ht 58.75 in | Wt 113.0 lb

## 2024-06-12 DIAGNOSIS — D219 Benign neoplasm of connective and other soft tissue, unspecified: Secondary | ICD-10-CM | POA: Insufficient documentation

## 2024-06-12 DIAGNOSIS — N939 Abnormal uterine and vaginal bleeding, unspecified: Secondary | ICD-10-CM | POA: Insufficient documentation

## 2024-06-12 MED ORDER — NORETHINDRONE 0.35 MG PO TABS: 1.0000 | ORAL_TABLET | Freq: Every day | ORAL | 11 refills | Status: DC

## 2024-06-12 NOTE — Assessment & Plan Note (Addendum)
 Reviewed ultrasound revealing: 9cm posterior fibroid  Discussed pathology of fibroids and benign nature. Reviewed that management of fibroids is dependent upon associated symptoms. Asymptomatic fibroids can be managed expectantly. However, fibroids can cause AUB and bulk symptoms, including dysmenorrhea, pelvic and lower back pain and pressure, dyspareunia, and constipation.  Reviewed hormonal and nonhormonal management, including the Mirena IUD (declined). Also reviewed interventional procedures like COLOMBIA and surgical management including myomectomy and hysterectomy.  She has elected for POP (reported worsening migraines with COC). Considering Depo and hysterectomy (likely RATLH,BS), however she is self pay and will look into cost.  F/u as needed

## 2024-06-26 ENCOUNTER — Other Ambulatory Visit: Payer: Self-pay | Admitting: Obstetrics and Gynecology

## 2024-06-26 DIAGNOSIS — N939 Abnormal uterine and vaginal bleeding, unspecified: Secondary | ICD-10-CM

## 2024-06-26 DIAGNOSIS — D219 Benign neoplasm of connective and other soft tissue, unspecified: Secondary | ICD-10-CM

## 2024-07-01 ENCOUNTER — Other Ambulatory Visit: Payer: Self-pay | Admitting: Family Medicine

## 2024-07-15 ENCOUNTER — Ambulatory Visit (INDEPENDENT_AMBULATORY_CARE_PROVIDER_SITE_OTHER): Payer: Self-pay | Admitting: Family Medicine

## 2024-07-15 ENCOUNTER — Encounter: Payer: Self-pay | Admitting: Family Medicine

## 2024-07-15 VITALS — BP 120/80 | HR 81 | Wt 114.6 lb

## 2024-07-15 DIAGNOSIS — D251 Intramural leiomyoma of uterus: Secondary | ICD-10-CM

## 2024-07-15 DIAGNOSIS — N939 Abnormal uterine and vaginal bleeding, unspecified: Secondary | ICD-10-CM

## 2024-07-15 NOTE — Progress Notes (Signed)
   Name: Diane Vaughn   Date of Visit: 07/15/24   Date of last visit with me: 07/01/2024   CHIEF COMPLAINT:  Chief Complaint  Patient presents with   Follow-up    4 week follow up.        HPI:  Discussed the use of AI scribe software for clinical note transcription with the patient, who gave verbal consent to proceed.  History of Present Illness   Diane Vaughn is a 43 year old female who presents with issues related to scheduling surgery and concerns about her bleeding after stopping norethindrone .  She has been experiencing difficulties in scheduling a gynecological surgery. Despite repeated calls to the OB-GYN office, she has not received a callback regarding an estimate for the surgery cost or an appointment date. She was informed to wait for two weeks, but the delay persists.  She was previously taking norethindrone  for bleeding control, which she reports was prescribed for birth control and bleeding management. She stopped the medication as advised by a nurse to assess her bleeding. Since discontinuing norethindrone , her bleeding has decreased and is currently less than before she started the medication.         OBJECTIVE:       05/06/2024   10:30 AM  Depression screen PHQ 2/9  Decreased Interest 0  Down, Depressed, Hopeless 0  PHQ - 2 Score 0     BP Readings from Last 3 Encounters:  07/15/24 120/80  06/12/24 100/64  06/10/24 122/72    BP 120/80   Pulse 81   Wt 114 lb 9.6 oz (52 kg)   SpO2 98%   BMI 23.34 kg/m    Physical Exam          Physical Exam Constitutional:      Appearance: Normal appearance.  Neurological:     General: No focal deficit present.     Mental Status: She is alert and oriented to person, place, and time. Mental status is at baseline.     ASSESSMENT/PLAN:   Assessment & Plan Intramural uterine fibroid  Abnormal uterine bleeding (AUB)    Assessment and Plan    Abnormal uterine bleeding Bleeding  improved after stopping norethindrone . Hormonal regulation likely contributed. - Restart norethindrone  if bleeding recurs. - Assist with OB-GYN communication for surgery scheduling. - Advised to call for assistance or questions.         Ivana Nicastro A. Vita MD [facility]

## 2024-08-26 ENCOUNTER — Ambulatory Visit (INDEPENDENT_AMBULATORY_CARE_PROVIDER_SITE_OTHER): Payer: Self-pay | Admitting: Obstetrics and Gynecology

## 2024-08-26 ENCOUNTER — Encounter: Payer: Self-pay | Admitting: *Deleted

## 2024-08-26 ENCOUNTER — Encounter: Payer: Self-pay | Admitting: Obstetrics and Gynecology

## 2024-08-26 ENCOUNTER — Other Ambulatory Visit (HOSPITAL_COMMUNITY)
Admission: RE | Admit: 2024-08-26 | Discharge: 2024-08-26 | Disposition: A | Discharge status: Home / Self Care | Payer: Self-pay | Source: Ambulatory Visit | Attending: Obstetrics and Gynecology | Admitting: Obstetrics and Gynecology

## 2024-08-26 VITALS — BP 120/64 | HR 75 | Temp 97.9°F | Wt 112.0 lb

## 2024-08-26 DIAGNOSIS — Z01812 Encounter for preprocedural laboratory examination: Secondary | ICD-10-CM

## 2024-08-26 DIAGNOSIS — Z124 Encounter for screening for malignant neoplasm of cervix: Secondary | ICD-10-CM

## 2024-08-26 DIAGNOSIS — D219 Benign neoplasm of connective and other soft tissue, unspecified: Secondary | ICD-10-CM

## 2024-08-26 DIAGNOSIS — N939 Abnormal uterine and vaginal bleeding, unspecified: Secondary | ICD-10-CM

## 2024-08-26 LAB — PREGNANCY, URINE: Preg Test, Ur: NEGATIVE

## 2024-08-26 NOTE — H&P (View-Only) (Signed)
 43 y.o. G83P2002 female with AUB and fibroids here for preop exam for RATLH, BS, cystoscopy scheduled 09/12/24. Significant Other.  Due to language barrier, an interpreter was present during the history-taking and subsequent discussion (and for part of the physical exam) with this patient. Works at [employer].  Patient's last menstrual period was 08/24/2024 (exact date).   She reports abnormal bleeding, two cycles a month. HVB with clotsx 7d twice month. Symptoms have been present for 6 months.  She reports periods have always been heavy with clots and dysmenorrhea.  Started COC 31yr ago. COC was recently stopped by PCP because of ongoing bleeding. She has also tried Depo in the past. She has had migraine with the COC. Migraines have improved since stopping the pill. No bleeding today. She has daily pressure, and pain with urination. Last PAP ~4yr ago, no history of abnl PAPs.  She is currently sexually active. No CP or SOB.  Urine sample provided: Yes  05/06/2024 hemoglobin 11.1, platelets 373K, low iron  studies  05/13/2024 TVUS: Uterus   Measurements: 9.1 x 2.7 x 5.0 cm = volume: 65 mL. There is an 8.9 x 6.3 x 8.4 cm posterior body intramural fibroid with associated mass effect and displacement of the endometrium.   Endometrium   Thickness: 8 mm. The endometrium is poorly visualized and suboptimally evaluated.   Right ovary   Measurements: 2.2 x 1.4 x 2.7 cm = volume: 4.2 mL. Normal appearance/no adnexal mass.   Left ovary   Measurements: 3.2 x 1.7 x 3.3 cm = volume: 9.3 mL. There is a 2.3 cm dominant follicle or corpus luteum in the left ovary.   Other findings   No abnormal free fluid.   IMPRESSION: 1. Large posterior body intramural fibroid with mass effect and displacement of the endometrium. 2. Unremarkable ovaries.   Birth control: No Sexually active: Yes   GYN HISTORY: No sig hx  OB History  Gravida Para Term Preterm AB Living  3 2 2   2   SAB IAB Ectopic  Multiple Live Births      2    # Outcome Date GA Lbr Len/2nd Weight Sex Type Anes PTL Lv  3 Gravida              Birth Comments: System Generated. Please review and update pregnancy details.  2 Term      Vag-Spont   LIV  1 Term      Vag-Spont   LIV   Past Medical History:  Diagnosis Date   Migraine    History reviewed. No pertinent surgical history. Current Outpatient Medications on File Prior to Visit  Medication Sig Dispense Refill   Iron , Ferrous Sulfate , 325 (65 Fe) MG TABS Take 325 mg by mouth daily. 30 tablet 11   Multiple Vitamin (MULTI VITAMIN PO) Take by mouth daily.     naproxen  (NAPROSYN ) 500 MG tablet Take 1 tablet (500 mg total) by mouth 2 (two) times daily as needed. 60 tablet 1   norethindrone  (MICRONOR ) 0.35 MG tablet Take 1 tablet (0.35 mg total) by mouth daily. 28 tablet 11   SUMAtriptan  (IMITREX ) 100 MG tablet TAKE 1 TABLET BY MOUTH AS NEEDED WHEN SYMPTOMS OCCUR, MAY TAKE 1 MORE TABLET IN 2 HOURS IF NEEDED, NO MORE THAN 2 IN 24 HOURS 10 tablet 0   No current facility-administered medications on file prior to visit.   Allergies  Allergen Reactions   Latex     Other Reaction(s): Not available    PE Today's  Vitals   08/26/24 1550  BP: 120/64  Pulse: 75  Temp: 97.9 F (36.6 C)  TempSrc: Oral  SpO2: 98%  Weight: 112 lb (50.8 kg)    Body mass index is 22.81 kg/m.  Physical Exam Vitals reviewed. Exam conducted with a chaperone present.  Constitutional:      General: She is not in acute distress.    Appearance: Normal appearance.  HENT:     Head: Normocephalic and atraumatic.     Nose: Nose normal.  Eyes:     Extraocular Movements: Extraocular movements intact.     Conjunctiva/sclera: Conjunctivae normal.  Cardiovascular:     Rate and Rhythm: Normal rate.  Pulmonary:     Effort: Pulmonary effort is normal. No respiratory distress.  Genitourinary:    General: Normal vulva.     Exam position: Lithotomy position.     Vagina: Normal. No vaginal  discharge.     Cervix: Normal. No cervical motion tenderness, discharge or lesion.     Uterus: Normal. Enlarged. Not tender.      Adnexa: Right adnexa normal and left adnexa normal.     Comments: Fullness noted along anterior CDS, clear posterior CDS Posterior cervix Musculoskeletal:        General: Normal range of motion.     Cervical back: Normal range of motion.  Neurological:     General: No focal deficit present.     Mental Status: She is alert.  Psychiatric:        Mood and Affect: Mood normal.        Behavior: Behavior normal.     Procedure Consent was signed. Timeout was performed. Speculum inserted into the vagina, cervix visualized and was prepped with Hibiclens.  A single-toothed tenaculum was placed on the anterior lip of the cervix to stabilize it.  The 3 mm pipelle was introduced into the endometrial cavity without difficulty to a depth of 8cm, suction initiated and a moderate amount of tissue was obtained and sent to pathology.  The instruments were removed from the patient's vagina.  Minimal bleeding from the cervix was noted.  The patient tolerated the procedure well.     Assessment and Plan:        Abnormal uterine bleeding (AUB) Assessment & Plan: Reviewed ultrasound revealing: 9cm posterior fibroid  Bleeding improved with POP, however she has elected for definitive therapy.  Plan for robotic assisted total laparoscopic hysterectomy and bilateral salpingectomy.  Discussed outpatient procedure. Reviewed that  recovery is usually 6 weeks with additional 4 weeks of pelvic rest. Risks including infections, bleeding, and damage to surrounding organs reviewed.  Patient is agreeable to blood transfusion in the event of an emergency.  Patient in agreement with initial plan. Orders placed for surgery. RTO for preop visit.  Preop checklist: Meds and allergies reviewed. Antibiotics: Ancef, flagyl DVT ppx: SCDs, lovenox Postop visit: 2, 6 week Additional  clearance: none Tubal and hysterectomy papers signed: N/A   Orders: -     Surgical pathology  Pre-procedure lab exam -     Pregnancy, urine  Fibroid -     Surgical pathology  Cervical cancer screening -     Cytology - PAP     Diane Vaughn LULLA Pa, MD

## 2024-08-26 NOTE — Patient Instructions (Signed)
 Preoperative instructions: Nothing to eat or drink after midnight, unless instructed differently regarding clear liquids by the anesthesia team at [facility]. Do not take any medications on the day of surgery, except those listed below: NONE Please follow all other instructions as provided by our surgical scheduler at [facility] and the anesthesia team at [facility].  Postoperative instructions: Clean your incision daily with mild soapy water.  Ensure that you thoroughly dry your wound following cleaning and keep dry throughout the day.  You can apply a thin layer of Aquaphor or Vaseline over your incision. Ice packs can be applied to your incision for up to 20 minutes at a time to help with pain and swelling.

## 2024-08-26 NOTE — Assessment & Plan Note (Signed)
 Reviewed ultrasound revealing: 9cm posterior fibroid  Bleeding improved with POP, however she has elected for definitive therapy.  Plan for robotic assisted total laparoscopic hysterectomy and bilateral salpingectomy.  Discussed outpatient procedure. Reviewed that  recovery is usually 6 weeks with additional 4 weeks of pelvic rest. Risks including infections, bleeding, and damage to surrounding organs reviewed.  Patient is agreeable to blood transfusion in the event of an emergency.  Patient in agreement with initial plan. Orders placed for surgery. RTO for preop visit.  Preop checklist: Meds and allergies reviewed. Antibiotics: Ancef, flagyl DVT ppx: SCDs, lovenox Postop visit: 2, 6 week Additional clearance: none Tubal and hysterectomy papers signed: N/A

## 2024-08-26 NOTE — Progress Notes (Signed)
 43 y.o. [id number] female with AUB and fibroids here for preop exam for RATLH, BS, cystoscopy scheduled 09/12/24. Significant Other.  Due to language barrier, an interpreter was present during the history-taking and subsequent discussion (and for part of the physical exam) with this patient. Works at [employer].  Patient's last menstrual period was 08/24/2024 (exact date).   She reports abnormal bleeding, two cycles a month. HVB with clotsx 7d twice month. Symptoms have been present for 6 months.  She reports periods have always been heavy with clots and dysmenorrhea.  Started COC 31yr ago. COC was recently stopped by PCP because of ongoing bleeding. She has also tried Depo in the past. She has had migraine with the COC. Migraines have improved since stopping the pill. No bleeding today. She has daily pressure, and pain with urination. Last PAP ~4yr ago, no history of abnl PAPs.  She is currently sexually active. No CP or SOB.  Urine sample provided: Yes  05/06/2024 hemoglobin 11.1, platelets 373K, low iron  studies  05/13/2024 TVUS: Uterus   Measurements: 9.1 x 2.7 x 5.0 cm = volume: 65 mL. There is an 8.9 x 6.3 x 8.4 cm posterior body intramural fibroid with associated mass effect and displacement of the endometrium.   Endometrium   Thickness: 8 mm. The endometrium is poorly visualized and suboptimally evaluated.   Right ovary   Measurements: 2.2 x 1.4 x 2.7 cm = volume: 4.2 mL. Normal appearance/no adnexal mass.   Left ovary   Measurements: 3.2 x 1.7 x 3.3 cm = volume: 9.3 mL. There is a 2.3 cm dominant follicle or corpus luteum in the left ovary.   Other findings   No abnormal free fluid.   IMPRESSION: 1. Large posterior body intramural fibroid with mass effect and displacement of the endometrium. 2. Unremarkable ovaries.   Birth control: No Sexually active: Yes   GYN HISTORY: No sig hx  OB History  Gravida Para Term Preterm AB Living  3 2 2   2   SAB IAB Ectopic  Multiple Live Births      2    # Outcome Date GA Lbr Len/2nd Weight Sex Type Anes PTL Lv  3 Gravida              Birth Comments: System Generated. Please review and update pregnancy details.  2 Term      Vag-Spont   LIV  1 Term      Vag-Spont   LIV   Past Medical History:  Diagnosis Date   Migraine    History reviewed. No pertinent surgical history. Current Outpatient Medications on File Prior to Visit  Medication Sig Dispense Refill   Iron , Ferrous Sulfate , 325 (65 Fe) MG TABS Take 325 mg by mouth daily. 30 tablet 11   Multiple Vitamin (MULTI VITAMIN PO) Take by mouth daily.     naproxen  (NAPROSYN ) 500 MG tablet Take 1 tablet (500 mg total) by mouth 2 (two) times daily as needed. 60 tablet 1   norethindrone  (MICRONOR ) 0.35 MG tablet Take 1 tablet (0.35 mg total) by mouth daily. 28 tablet 11   SUMAtriptan  (IMITREX ) 100 MG tablet TAKE 1 TABLET BY MOUTH AS NEEDED WHEN SYMPTOMS OCCUR, MAY TAKE 1 MORE TABLET IN 2 HOURS IF NEEDED, NO MORE THAN 2 IN 24 HOURS 10 tablet 0   No current facility-administered medications on file prior to visit.   Allergies  Allergen Reactions   Latex     Other Reaction(s): Not available    PE Today's  Vitals   08/26/24 1550  BP: 120/64  Pulse: 75  Temp: 97.9 F (36.6 C)  TempSrc: Oral  SpO2: 98%  Weight: 112 lb (50.8 kg)    Body mass index is 22.81 kg/m.  Physical Exam Vitals reviewed. Exam conducted with a chaperone present.  Constitutional:      General: She is not in acute distress.    Appearance: Normal appearance.  HENT:     Head: Normocephalic and atraumatic.     Nose: Nose normal.  Eyes:     Extraocular Movements: Extraocular movements intact.     Conjunctiva/sclera: Conjunctivae normal.  Cardiovascular:     Rate and Rhythm: Normal rate.  Pulmonary:     Effort: Pulmonary effort is normal. No respiratory distress.  Genitourinary:    General: Normal vulva.     Exam position: Lithotomy position.     Vagina: Normal. No vaginal  discharge.     Cervix: Normal. No cervical motion tenderness, discharge or lesion.     Uterus: Normal. Enlarged. Not tender.      Adnexa: Right adnexa normal and left adnexa normal.     Comments: Fullness noted along anterior CDS, clear posterior CDS Posterior cervix Musculoskeletal:        General: Normal range of motion.     Cervical back: Normal range of motion.  Neurological:     General: No focal deficit present.     Mental Status: She is alert.  Psychiatric:        Mood and Affect: Mood normal.        Behavior: Behavior normal.     Procedure Consent was signed. Timeout was performed. Speculum inserted into the vagina, cervix visualized and was prepped with Hibiclens.  A single-toothed tenaculum was placed on the anterior lip of the cervix to stabilize it.  The 3 mm pipelle was introduced into the endometrial cavity without difficulty to a depth of 8cm, suction initiated and a moderate amount of tissue was obtained and sent to pathology.  The instruments were removed from the patient's vagina.  Minimal bleeding from the cervix was noted.  The patient tolerated the procedure well.     Assessment and Plan:        Abnormal uterine bleeding (AUB) Assessment & Plan: Reviewed ultrasound revealing: 9cm posterior fibroid  Bleeding improved with POP, however she has elected for definitive therapy.  Plan for robotic assisted total laparoscopic hysterectomy and bilateral salpingectomy.  Discussed outpatient procedure. Reviewed that  recovery is usually 6 weeks with additional 4 weeks of pelvic rest. Risks including infections, bleeding, and damage to surrounding organs reviewed.  Patient is agreeable to blood transfusion in the event of an emergency.  Patient in agreement with initial plan. Orders placed for surgery. RTO for preop visit.  Preop checklist: Meds and allergies reviewed. Antibiotics: Ancef, flagyl DVT ppx: SCDs, lovenox Postop visit: 2, 6 week Additional  clearance: none Tubal and hysterectomy papers signed: N/A   Orders: -     Surgical pathology  Pre-procedure lab exam -     Pregnancy, urine  Fibroid -     Surgical pathology  Cervical cancer screening -     Cytology - PAP     Keitra Carusone LULLA Pa, MD

## 2024-08-29 LAB — SURGICAL PATHOLOGY

## 2024-09-01 ENCOUNTER — Ambulatory Visit: Payer: Self-pay | Admitting: Obstetrics and Gynecology

## 2024-09-01 LAB — CYTOLOGY - PAP
Comment: NEGATIVE
Diagnosis: NEGATIVE
High risk HPV: NEGATIVE

## 2024-09-03 ENCOUNTER — Encounter (HOSPITAL_COMMUNITY): Payer: Self-pay | Admitting: Obstetrics and Gynecology

## 2024-09-03 NOTE — Progress Notes (Addendum)
 Addendum:   Received confirmation for female Spanish interpreter via email from [facility] interpreting, copy with chart.  Spoke w/ via phone for pre-op interview--- pt,  thru Spanish pacific interpreter ID # [phone] Lab needs dos----   cbc/ t&s/ upt (per anes)       Lab results------ no COVID test -----patient states asymptomatic no test needed Arrive at ------- 0530 on 09-12-2024 NPO after MN w/ exception sips of water w/ meds Pre-Surgery Ensure or G2: n/a  Med rec completed Medications to take morning of surgery ----- if needed imitrex  Diabetic medication ----- n/  GLP1 agonist last dose: n/a GLP1 instructions:  Patient instructed no nail polish to be worn day of surgery Patient instructed to bring photo id and insurance card day of surgery Patient aware to have Driver (ride ) / caregiver    for 24 hours after surgery - sig other, antonio pez quintana and daughter , suzen sniff Patient Special Instructions ----- went over surgical instructions below.  Questions answered.  Pt verbalized understanding of instructions.  Pre-Op special Instructions ----- sent inbox message in epic to Dr Dallie on 08-28-2024 and 09-03-2024,  requested pre-op orders Pt requested female spanish interpreter.  Sent request via email to [facility] Interpreting.  Patient verbalized understanding of instructions that were given at this phone interview. Patient denies chest pain, sob, fever, cough at the interview.     SDW Instructions given:  Your procedure is scheduled on:   Friday, 09-12-2024 Report to [facility] , [address] Main Entrance A At 5:30 AM, and check in at the Admitting office down the hall on the left  Please bring your insurance card and photo ID Call this number for questions prior to surgery:  [phone] Day of surgery questions call this number:  [phone]  Remember: Do Not eat any food and Do Not drink any liquids after midnight night before  surgery.  This Includes no water,  candy,  gum,  and mints.  Take these medications the morning of surgery with Sips of Water:   If needed may take imitrex   As of today STOP taking aspirin(unless otherwise instructed by your surgeon) aleve , Naproxen , ibuprofen, motrin, advil, goody's, BC's, all herbal medications, fish oil, and  All vitamins  Do Not Smoke (tobacco/ vaping) and Do Not drink alcohol 24 hours prior to your procedure  Contacts, glasses, dentures or bridgework may not be worn into surgery.  Bring case/ container/ solution/ etc., To put them in day of surgery.  Patients discharged the day of surgery will not be allowed to drive home, they will need responsible driver and Will need someone to stay with them for 24 hours at home.   Special Instructions for Preparing for Surgery:  Shower the night before surgery and morning of surgery with Dial soap (liquid or bar) unscented) Using clean clean cloth and clean towel each time. Wear clean pajamas to bed the night before surgery. Place clean sheets on your bed the night before after your shower.  Do not sleep with pets. Morning of surgery after you towel off , Do Not Apply any lotions, power, oils, deodorants (may use underarm deodorant), cologne/ perfumes or makeup. Do Not wear nail polish, gel polish, artificial nails, or any other type of covering on natural finger nails (toe nails are okay) Do Not wear jewelry / piercing's/ metal/ permanent jewelry must be removed prior to arrival day of surgery. Please brush your teeth and rinse mouth out. Put on clean /  comfortable clothes. [facility] is not responsible for valuables/ personal belongings.   Questions were answered.  Patient verbalized understanding of instructions.

## 2024-09-12 ENCOUNTER — Other Ambulatory Visit: Payer: Self-pay

## 2024-09-12 ENCOUNTER — Encounter (HOSPITAL_COMMUNITY): Admission: RE | Disposition: A | Payer: Self-pay | Source: Home / Self Care | Attending: Obstetrics and Gynecology

## 2024-09-12 ENCOUNTER — Ambulatory Visit (HOSPITAL_COMMUNITY): Payer: Self-pay | Admitting: Anesthesiology

## 2024-09-12 ENCOUNTER — Encounter (HOSPITAL_COMMUNITY): Payer: Self-pay | Admitting: Obstetrics and Gynecology

## 2024-09-12 ENCOUNTER — Ambulatory Visit (HOSPITAL_COMMUNITY)
Admission: RE | Admit: 2024-09-12 | Discharge: 2024-09-12 | Disposition: A | Payer: Self-pay | Attending: Obstetrics and Gynecology | Admitting: Obstetrics and Gynecology

## 2024-09-12 DIAGNOSIS — Z01818 Encounter for other preprocedural examination: Secondary | ICD-10-CM

## 2024-09-12 DIAGNOSIS — N939 Abnormal uterine and vaginal bleeding, unspecified: Secondary | ICD-10-CM | POA: Diagnosis present

## 2024-09-12 DIAGNOSIS — D219 Benign neoplasm of connective and other soft tissue, unspecified: Secondary | ICD-10-CM | POA: Diagnosis present

## 2024-09-12 DIAGNOSIS — D251 Intramural leiomyoma of uterus: Secondary | ICD-10-CM | POA: Insufficient documentation

## 2024-09-12 DIAGNOSIS — N72 Inflammatory disease of cervix uteri: Secondary | ICD-10-CM | POA: Insufficient documentation

## 2024-09-12 HISTORY — DX: Presence of spectacles and contact lenses: Z97.3

## 2024-09-12 HISTORY — DX: Dysmenorrhea, unspecified: N94.6

## 2024-09-12 HISTORY — PX: CYSTOSCOPY: SHX5120

## 2024-09-12 HISTORY — DX: Iron deficiency anemia, unspecified: D50.9

## 2024-09-12 HISTORY — DX: Abnormal uterine and vaginal bleeding, unspecified: N93.9

## 2024-09-12 HISTORY — PX: HYSTERECTOMY, TOTAL, LAPAROSCOPIC, ROBOT-ASSISTED WITH SALPINGECTOMY: SHX7587

## 2024-09-12 HISTORY — DX: Leiomyoma of uterus, unspecified: D25.9

## 2024-09-12 LAB — CBC
HCT: 37.5 % (ref 36.0–46.0)
Hemoglobin: 12.7 g/dL (ref 12.0–15.0)
MCH: 30.8 pg (ref 26.0–34.0)
MCHC: 33.9 g/dL (ref 30.0–36.0)
MCV: 90.8 fL (ref 80.0–100.0)
Platelets: 230 K/uL (ref 150–400)
RBC: 4.13 MIL/uL (ref 3.87–5.11)
RDW: 12.8 % (ref 11.5–15.5)
WBC: 7.9 K/uL (ref 4.0–10.5)
nRBC: 0 % (ref 0.0–0.2)

## 2024-09-12 LAB — POCT PREGNANCY, URINE: Preg Test, Ur: NEGATIVE

## 2024-09-12 LAB — TYPE AND SCREEN
ABO/RH(D): O POS
Antibody Screen: NEGATIVE

## 2024-09-12 SURGERY — HYSTERECTOMY, TOTAL, LAPAROSCOPIC, ROBOT-ASSISTED WITH SALPINGECTOMY
Anesthesia: General

## 2024-09-12 MED ORDER — METRONIDAZOLE 500 MG/100ML IV SOLN
INTRAVENOUS | Status: AC
  Filled 2024-09-12: qty 100

## 2024-09-12 MED ORDER — ACETAMINOPHEN 500 MG PO TABS: 1000.0000 mg | ORAL_TABLET | Freq: Three times a day (TID) | ORAL | 0 refills | Status: DC | PRN

## 2024-09-12 MED ORDER — CHLORHEXIDINE GLUCONATE 0.12 % MT SOLN
OROMUCOSAL | Status: DC
  Filled 2024-09-12: qty 15

## 2024-09-12 MED ORDER — METRONIDAZOLE 500 MG/100ML IV SOLN
500.0000 mg | Freq: Once | INTRAVENOUS | Status: AC
  Administered 2024-09-12: 500 mg via INTRAVENOUS

## 2024-09-12 MED ORDER — MIDAZOLAM HCL 2 MG/2ML IJ SOLN
INTRAMUSCULAR | Status: AC
  Filled 2024-09-12: qty 2

## 2024-09-12 MED ORDER — LIDOCAINE 2% (20 MG/ML) 5 ML SYRINGE
INTRAMUSCULAR | Status: AC
  Filled 2024-09-12: qty 5

## 2024-09-12 MED ORDER — BUPIVACAINE HCL (PF) 0.5 % IJ SOLN
INTRAMUSCULAR | Status: AC
  Filled 2024-09-12: qty 90

## 2024-09-12 MED ORDER — OXYCODONE HCL 5 MG PO TABS: 5.0000 mg | ORAL_TABLET | Freq: Once | ORAL | Status: DC | PRN

## 2024-09-12 MED ORDER — ACETAMINOPHEN 500 MG PO TABS
ORAL_TABLET | ORAL | Status: DC
  Filled 2024-09-12: qty 2

## 2024-09-12 MED ORDER — 0.9 % SODIUM CHLORIDE (POUR BTL) OPTIME
TOPICAL | Status: DC | PRN
  Administered 2024-09-12: 1000 mL

## 2024-09-12 MED ORDER — ONDANSETRON HCL 4 MG/2ML IJ SOLN
INTRAMUSCULAR | Status: DC | PRN
  Administered 2024-09-12: 4 mg via INTRAVENOUS

## 2024-09-12 MED ORDER — PROPOFOL 10 MG/ML IV BOLUS
INTRAVENOUS | Status: DC | PRN
  Administered 2024-09-12: 110 mg via INTRAVENOUS

## 2024-09-12 MED ORDER — ONDANSETRON HCL 4 MG/2ML IJ SOLN
INTRAMUSCULAR | Status: AC
  Filled 2024-09-12: qty 2

## 2024-09-12 MED ORDER — IBUPROFEN 800 MG PO TABS: 800.0000 mg | ORAL_TABLET | Freq: Three times a day (TID) | ORAL | 0 refills | Status: DC | PRN

## 2024-09-12 MED ORDER — CELECOXIB 200 MG PO CAPS
400.0000 mg | ORAL_CAPSULE | ORAL | Status: AC
  Administered 2024-09-12: 400 mg via ORAL

## 2024-09-12 MED ORDER — KETOROLAC TROMETHAMINE 30 MG/ML IJ SOLN: INTRAMUSCULAR | Status: DC | PRN

## 2024-09-12 MED ORDER — SUGAMMADEX SODIUM 200 MG/2ML IV SOLN
INTRAVENOUS | Status: DC | PRN
  Administered 2024-09-12: 200 mg via INTRAVENOUS

## 2024-09-12 MED ORDER — ENOXAPARIN SODIUM 40 MG/0.4ML IJ SOSY
PREFILLED_SYRINGE | INTRAMUSCULAR | Status: DC
  Filled 2024-09-12: qty 0.4

## 2024-09-12 MED ORDER — ROCURONIUM BROMIDE 10 MG/ML (PF) SYRINGE
PREFILLED_SYRINGE | INTRAVENOUS | Status: DC | PRN
  Administered 2024-09-12 (×2): 20 mg via INTRAVENOUS
  Administered 2024-09-12: 60 mg via INTRAVENOUS

## 2024-09-12 MED ORDER — METHYLENE BLUE 20 MG/2ML IV SOSY
PREFILLED_SYRINGE | INTRAVENOUS | Status: AC
  Filled 2024-09-12: qty 2

## 2024-09-12 MED ORDER — ACETAMINOPHEN 500 MG PO TABS
1000.0000 mg | ORAL_TABLET | ORAL | Status: AC
  Administered 2024-09-12: 1000 mg via ORAL

## 2024-09-12 MED ORDER — CHLORHEXIDINE GLUCONATE 0.12 % MT SOLN
15.0000 mL | Freq: Once | OROMUCOSAL | Status: AC
  Administered 2024-09-12: 15 mL via OROMUCOSAL

## 2024-09-12 MED ORDER — DEXMEDETOMIDINE HCL IN NACL 80 MCG/20ML IV SOLN
INTRAVENOUS | Status: DC | PRN
  Administered 2024-09-12 (×2): 8 ug via INTRAVENOUS
  Administered 2024-09-12: 4 ug via INTRAVENOUS

## 2024-09-12 MED ORDER — FENTANYL CITRATE (PF) 100 MCG/2ML IJ SOLN
25.0000 ug | INTRAMUSCULAR | Status: DC | PRN
  Administered 2024-09-12: 50 ug via INTRAVENOUS

## 2024-09-12 MED ORDER — OXYCODONE HCL 5 MG PO TABS: ORAL_TABLET | ORAL | 0 refills | Status: DC

## 2024-09-12 MED ORDER — BUPIVACAINE HCL (PF) 0.5 % IJ SOLN
INTRAMUSCULAR | Status: DC | PRN
  Administered 2024-09-12: 23 mL

## 2024-09-12 MED ORDER — FENTANYL CITRATE (PF) 100 MCG/2ML IJ SOLN
INTRAMUSCULAR | Status: AC
  Filled 2024-09-12: qty 2

## 2024-09-12 MED ORDER — CEFAZOLIN SODIUM-DEXTROSE 2-4 GM/100ML-% IV SOLN
2.0000 g | INTRAVENOUS | Status: AC
  Administered 2024-09-12: 2 g via INTRAVENOUS

## 2024-09-12 MED ORDER — LACTATED RINGERS IV SOLN: INTRAVENOUS | Status: DC

## 2024-09-12 MED ORDER — CELECOXIB 200 MG PO CAPS
ORAL_CAPSULE | ORAL | Status: DC
  Filled 2024-09-12: qty 2

## 2024-09-12 MED ORDER — ENOXAPARIN SODIUM 40 MG/0.4ML IJ SOSY
40.0000 mg | PREFILLED_SYRINGE | INTRAMUSCULAR | Status: AC
  Administered 2024-09-12: 40 mg via SUBCUTANEOUS

## 2024-09-12 MED ORDER — ORAL CARE MOUTH RINSE: 15.0000 mL | Freq: Once | OROMUCOSAL | Status: AC

## 2024-09-12 MED ORDER — DROPERIDOL 2.5 MG/ML IJ SOLN
INTRAMUSCULAR | Status: AC
  Filled 2024-09-12: qty 2

## 2024-09-12 MED ORDER — ROCURONIUM BROMIDE 10 MG/ML (PF) SYRINGE
PREFILLED_SYRINGE | INTRAVENOUS | Status: AC
  Filled 2024-09-12: qty 10

## 2024-09-12 MED ORDER — PROPOFOL 10 MG/ML IV BOLUS
INTRAVENOUS | Status: AC
  Filled 2024-09-12: qty 20

## 2024-09-12 MED ORDER — LIDOCAINE 2% (20 MG/ML) 5 ML SYRINGE
INTRAMUSCULAR | Status: DC | PRN
  Administered 2024-09-12: 40 mg via INTRAVENOUS

## 2024-09-12 MED ORDER — SODIUM CHLORIDE 0.9 % IR SOLN
Status: DC | PRN
  Administered 2024-09-12: 1000 mL via INTRAVESICAL
  Administered 2024-09-12: 1000 mL

## 2024-09-12 MED ORDER — FENTANYL CITRATE (PF) 250 MCG/5ML IJ SOLN
INTRAMUSCULAR | Status: DC | PRN
  Administered 2024-09-12 (×2): 50 ug via INTRAVENOUS
  Administered 2024-09-12: 100 ug via INTRAVENOUS
  Administered 2024-09-12: 50 ug via INTRAVENOUS

## 2024-09-12 MED ORDER — DROPERIDOL 2.5 MG/ML IJ SOLN
0.6250 mg | Freq: Once | INTRAMUSCULAR | Status: AC
  Administered 2024-09-12: 0.625 mg via INTRAVENOUS

## 2024-09-12 MED ORDER — DEXAMETHASONE SOD PHOSPHATE PF 10 MG/ML IJ SOLN
INTRAMUSCULAR | Status: DC | PRN
  Administered 2024-09-12: 10 mg via INTRAVENOUS

## 2024-09-12 MED ORDER — FENTANYL CITRATE (PF) 250 MCG/5ML IJ SOLN
INTRAMUSCULAR | Status: AC
  Filled 2024-09-12: qty 5

## 2024-09-12 MED ORDER — ACETAMINOPHEN 10 MG/ML IV SOLN: 1000.0000 mg | Freq: Once | INTRAVENOUS | Status: DC | PRN

## 2024-09-12 MED ORDER — OXYCODONE HCL 5 MG/5ML PO SOLN: 5.0000 mg | Freq: Once | ORAL | Status: DC | PRN

## 2024-09-12 MED ORDER — MIDAZOLAM HCL (PF) 2 MG/2ML IJ SOLN
INTRAMUSCULAR | Status: DC | PRN
  Administered 2024-09-12: 2 mg via INTRAVENOUS

## 2024-09-12 MED ORDER — LIDOCAINE-EPINEPHRINE 1 %-1:100000 IJ SOLN
INTRAMUSCULAR | Status: AC
  Filled 2024-09-12: qty 3

## 2024-09-12 MED ORDER — KETOROLAC TROMETHAMINE 30 MG/ML IJ SOLN
INTRAMUSCULAR | Status: AC
  Filled 2024-09-12: qty 1

## 2024-09-12 MED ORDER — CEFAZOLIN SODIUM-DEXTROSE 2-4 GM/100ML-% IV SOLN
INTRAVENOUS | Status: AC
  Filled 2024-09-12: qty 100

## 2024-09-12 SURGICAL SUPPLY — 52 items
BLADE SURG 10 STRL SS (BLADE) IMPLANT
CHLORAPREP W/TINT 26 (MISCELLANEOUS) ×2 IMPLANT
COVER BACK TABLE 60X90IN (DRAPES) ×2 IMPLANT
COVER TIP SHEARS 8 DVNC (MISCELLANEOUS) ×2 IMPLANT
DEFOGGER SCOPE WARM SEASHARP (MISCELLANEOUS) ×2 IMPLANT
DERMABOND ADVANCED .7 DNX12 (GAUZE/BANDAGES/DRESSINGS) ×2 IMPLANT
DRAPE ARM DVNC X/XI (DISPOSABLE) ×8 IMPLANT
DRAPE COLUMN DVNC XI (DISPOSABLE) ×2 IMPLANT
DRAPE SURG IRRIG POUCH 19X23 (DRAPES) ×2 IMPLANT
DRAPE UTILITY XL STRL (DRAPES) ×2 IMPLANT
DRIVER NDL MEGA SUTCUT DVNCXI (INSTRUMENTS) IMPLANT
ELECTRODE REM PT RTRN 9FT ADLT (ELECTROSURGICAL) ×2 IMPLANT
FORCEPS BPLR 8 MD DVNC XI (FORCEP) IMPLANT
FORCEPS PROGRASP DVNC XI (FORCEP) IMPLANT
GAUZE 4X4 16PLY ~~LOC~~+RFID DBL (SPONGE) IMPLANT
GLOVE BIO SURGEON STRL SZ7 (GLOVE) ×6 IMPLANT
GLOVE BIOGEL PI IND STRL 7.0 (GLOVE) ×6 IMPLANT
GOWN STRL REUS W/ TWL XL LVL3 (GOWN DISPOSABLE) ×6 IMPLANT
HIBICLENS CHG 4% 4OZ BTL (MISCELLANEOUS) ×4 IMPLANT
HOLDER FOLEY CATH W/STRAP (MISCELLANEOUS) IMPLANT
IRRIGATION SUCT STRKRFLW 2 WTP (MISCELLANEOUS) ×2 IMPLANT
KIT PINK PAD W/HEAD ARM REST (MISCELLANEOUS) ×2 IMPLANT
KIT TURNOVER KIT B (KITS) ×2 IMPLANT
LEGGING LITHOTOMY PAIR STRL (DRAPES) ×2 IMPLANT
MANIFOLD NEPTUNE II (INSTRUMENTS) ×2 IMPLANT
OBTURATOR OPTICALSTD 8 DVNC (TROCAR) ×2 IMPLANT
OCCLUDER COLPOPNEUMO (BALLOONS) IMPLANT
PACK ROBOT WH (CUSTOM PROCEDURE TRAY) ×2 IMPLANT
PAD OB MATERNITY 11 LF (PERSONAL CARE ITEMS) ×2 IMPLANT
POWDER SURGICEL 3.0 GRAM (HEMOSTASIS) IMPLANT
RUMI II 3.0CM BLUE KOH-EFFICIE (DISPOSABLE) IMPLANT
SCISSORS MNPLR CVD DVNC XI (INSTRUMENTS) IMPLANT
SEAL UNIV 5-12 XI (MISCELLANEOUS) ×8 IMPLANT
SEALER VESSEL EXT DVNC XI (MISCELLANEOUS) IMPLANT
SET CYSTO IRRIGATION (SET/KITS/TRAYS/PACK) ×2 IMPLANT
SET IRRIG Y-TYPE CYSTO (SET/KITS/TRAYS/PACK) IMPLANT
SET TUBE SMOKE EVAC HIGH FLOW (TUBING) ×2 IMPLANT
SOLN 0.9% NACL POUR BTL 1000ML (IV SOLUTION) ×2 IMPLANT
SPIKE FLUID TRANSFER (MISCELLANEOUS) ×2 IMPLANT
SUT MNCRL AB 3-0 PS2 18 (SUTURE) ×2 IMPLANT
SUT VIC AB 0 CT1 27XBRD ANBCTR (SUTURE) IMPLANT
SUT VLOC 180 0 9IN GS21 (SUTURE) ×4 IMPLANT
TIP ENDOSCOPIC SURGICEL (TIP) IMPLANT
TIP RUMI ORANGE 6.7MMX12CM (TIP) IMPLANT
TIP UTERINE 5.1X6CM LAV DISP (MISCELLANEOUS) IMPLANT
TIP UTERINE 6.7X10CM GRN DISP (MISCELLANEOUS) IMPLANT
TIP UTERINE 6.7X6CM WHT DISP (MISCELLANEOUS) IMPLANT
TIP UTERINE 6.7X8CM BLUE DISP (MISCELLANEOUS) IMPLANT
TOWEL GREEN STERILE (TOWEL DISPOSABLE) ×2 IMPLANT
TRAY FOLEY W/BAG SLVR 14FR (SET/KITS/TRAYS/PACK) ×2 IMPLANT
TROCAR KII 8X100ML NONTHREADED (TROCAR) IMPLANT
UNDERPAD 30X36 HEAVY ABSORB (UNDERPADS AND DIAPERS) ×2 IMPLANT

## 2024-09-12 NOTE — Discharge Instructions (Addendum)
 Postoperative instructions: Take 1000mg  tylenol  and 800mg  ibuprofen  every 8 hours for the first week and then every 8 hours as needed. Take other pain medications as prescribed. You may use gas-ex to relieve gas pains.  When you are not sleeping, plan to rest in a chair. Limit the time you spend in bed. For the first week, plan short walks every 2 hours that you are awake. You can increase your walking time based on your pain and fatigue level.  Clean your incision daily with mild soapy water. Ensure that you thoroughly dry your wound following cleaning and keep dry throughout the day.  You can apply a thin layer of Aquaphor or Vaseline over your incision. Ice packs can be applied to your incision for up to 20 minutes at a time to help with pain and swelling.  You may shower when you are ready. Avoid tub baths to keep your vaginal incision clean. Do not place anything inside the vagina for 10wk or longer if you are still spotting or cramping. This includes use of tampons, intercourse, douching, public bathing (including hot tubs, pools, the ocean).      Post Anesthesia Home Care Instructions  Activity: Get plenty of rest for the remainder of the day. A responsible individual must stay with you for 24 hours following the procedure.  For the next 24 hours, DO NOT: -Drive a car -Advertising copywriter -Drink alcoholic beverages -Take any medication unless instructed by your physician -Make any legal decisions or sign important papers.  Meals: Start with liquid foods such as gelatin or soup. Progress to regular foods as tolerated. Avoid greasy, spicy, heavy foods. If nausea and/or vomiting occur, drink only clear liquids until the nausea and/or vomiting subsides. Call your physician if vomiting continues.  Special Instructions/Symptoms: Your throat may feel dry or sore from the anesthesia or the breathing tube placed in your throat during surgery. If this causes discomfort, gargle with warm  salt water. The discomfort should disappear within 24 hours.  Diane Vaughn

## 2024-09-12 NOTE — Transfer of Care (Signed)
 Immediate Anesthesia Transfer of Care Note  Patient: Diane Vaughn  Procedure(s) Performed: HYSTERECTOMY, TOTAL, LAPAROSCOPIC, ROBOT-ASSISTED WITH SALPINGECTOMY (Bilateral) CYSTOSCOPY  Patient Location: PACU  Anesthesia Type:General  Level of Consciousness: awake, alert , patient cooperative, and responds to stimulation  Airway & Oxygen Therapy: Patient Spontanous Breathing and Patient connected to face mask oxygen  Post-op Assessment: Report given to RN, Post -op Vital signs reviewed and stable, and Patient moving all extremities X 4  Post vital signs: Reviewed and stable  Last Vitals:  Vitals Value Taken Time  BP 100/56 09/12/24 10:36  Temp 36.6 C 09/12/24 10:31  Pulse 66 09/12/24 10:38  Resp 21 09/12/24 10:38  SpO2 100 % 09/12/24 10:38  Vitals shown include unfiled device data.  Last Pain:  Vitals:   09/12/24 0610  TempSrc: Oral  PainSc: 7       Patients Stated Pain Goal: 5 (09/12/24 0610)  Complications: No notable events documented.

## 2024-09-12 NOTE — Op Note (Signed)
 09/12/24 [id number] Diane Vaughn   OPERATIVE REPORT   Preop Diagnosis: AUB, fibroids Postop Diagnosis: same  Procedure: Procedure(s): HYSTERECTOMY, TOTAL, LAPAROSCOPIC, ROBOT-ASSISTED WITH SALPINGECTOMY CYSTOSCOPY   Surgeon: Vera LULLA Pa, MD  Assistant: Circulator: Waddell Silvano HERO, RN Physician Assistant: Nicholaus Jorene BRAVO, PA-C Scrub Person: Janell, Marina  CINDI Neysa Search C   Fluids: please see anesthesia report   Complications: None Anesthesia: General    Findings: 12cm uterus with large posterior fibroid, weighing 400g, normal ovaries and tubes Cystoscopy at the end of the case with normal bladder and patent ureters bilaterally.  Bilateral jets noted.   Estimated blood loss: 50cc   Specimens:  ID Type Source Tests Collected by Time Destination  1 : Uterus Cervix and Bilateral Tubes Tissue PATH Soft tissue SURGICAL PATHOLOGY Pa Vera LULLA, MD 09/12/2024 [phone]      Disposition of specimen: Pathology   Patient is taken to the operating room. She is placed in the supine position. She is a running IV in place. Informed consent was present on the chart. SCDs on her lower extremities and functioning properly. General endotracheal anesthesia was administered by the anesthesia staff without difficulty. Her legs were placed in the low lithotomy position in [place]ie stirrups. Her arms were tucked by the side.     Chloraprep was then used to prep the abdomen and Hibiclens  was used to prep the inner thighs, perineum and vagina. Once 3 minutes had past the patient was draped in a normal standard fashion. A proper time out was performed and everyone agreed.  The legs were lifted to the high lithotomy position. A bivalve speculum was inserted into the vagina and the anterior lip of the cervix was grasped with single-tooth tenaculum.  The uterus sounded to 12 cm. Pratt dilators were used to dilate the cervix to 29fr.  The RUMI uterine manipulator was obtained inserted into the  endometrial cavity and the bulb of the disposable tip was inflated with 10 cc of normal saline. There was a good fit of the KOH ring around the cervix. The tenaculum and bivavle speculum was removed. There is also good manipulation of the uterus.  A Foley catheter was placed to straight drain.  Clear urine was noted. Legs were lowered to the low lithotomy position and attention was turned the abdomen.   Superior to the umbilicus, 0.5% marcaine  used to anesthetize the skin.  Using #11 blade, 8mm skin incision was made.  The 8mm robotic trocar and sleeve was inserted under direct visualization.  CO2 gas was started and patient was placed in trendelenburg position.  Two additional 8mm ports were placed under direct visualization in the left and right lower quadrant.   A fourth port was placed in the RLQ. The robot was docked in standard fashion.   Ureters were identified.  Attention was turned to the right side.  The right fallopian tube was cauterized and transected along the mesosalpinx.  Attention was turned to the right round ligament.  Right round ligament was serially clamped cauterized and incised.  The utero-ovarian ligament was then cauterized and ligated. The anterior and posterior peritoneum of the inferior leaf of the broad ligament were opened. The beginning of the bladder flap was created.  The bladder was taken down below the level of the KOH ring. The right uterine artery skeletonized and then just superior to the KOH ring this vessel was serially clamped, cauterized, and incised. Posterior fibroid made skeletonization of the right side more difficult.   The same procedure was  completed on the left to transect the left fallopian tube, left utero-ovarian ligaments. The anterior and posterior peritoneum of the inferior leaf of the broad ligament were opened. The remainder of the bladder flap was created using sharp dissection. The bladder was well below the level of the KOH ring. The left uterine  artery skeletonized. Then the left uterine artery, above the level of the KOH ring, was serially clamped cauterized and incised. The uterus was devascularized at this point.   The colpotomy was performed.  This was carried around a circumferential fashion until the vaginal mucosa was completely incised in the specimen was freed. I scrubbed back into the case, and the specimen was morcellated at the vagina using a scalpel. The specimen was then delivered to the vagina.  A vaginal occlusive device was used to maintain the pneumoperitoneum   Instruments were changed with a needle driver and prograsp.  Using a 9 inch 0 V-lock suture, the right angle of the cuff was closed by incorporating the anterior and posterior vaginal mucosa in each stitch. Two stitches were brought back towards the midline and the suture was cut flush with the vagina. The needle was brought out the pelvis. A second suture was used to close the left angle of the cuff in a running fashion and then the peritoneum. Two stitches were brought back towards the midline and the suture was cut flush with the vagina. The needle was brought out the pelvis. The pelvis was irrigated. All pedicles were inspected. No bleeding was noted. Surgicel powder was applied to the surgical beds.    The Foley catheter was removed.  Cystoscopy was performed.  No sutures or bladder injuries were noted.  Ureters were noted with normal urine jets from each one was seen.  Cystoscope was removed.   At this point the procedure was completed.  The remaining instruments were removed.  The ports were removed under direct visualization of the laparoscope and the pneumoperitoneum was relieved.  Attention was then turned the vagina and the cuff was inspected.   The skin was then closed with subcuticular stitches of 3-0 Vicryl. The skin was cleansed. Dermabond was applied.  No bleeding was noted.  Sponge, lap, needle, instrument counts were correct x2. Patient tolerated the  procedure very well. She was awakened from anesthesia, extubated and taken to recovery in stable condition.    Vera LULLA Pa, MD

## 2024-09-12 NOTE — Anesthesia Procedure Notes (Signed)
 Procedure Name: Intubation Date/Time: 09/12/2024 7:39 AM  Performed by: Jolynn Mage, CRNAPre-anesthesia Checklist: Patient identified, Patient being monitored, Timeout performed, Emergency Drugs available and Suction available Patient Re-evaluated:Patient Re-evaluated prior to induction Oxygen Delivery Method: Circle System Utilized Preoxygenation: Pre-oxygenation with 100% oxygen Induction Type: IV induction Ventilation: Mask ventilation without difficulty Laryngoscope Size: Miller and 2 Grade View: Grade I Tube type: Oral Tube size: 7.0 mm Number of attempts: 1 Airway Equipment and Method: Stylet Placement Confirmation: ETT inserted through vocal cords under direct vision, positive ETCO2 and breath sounds checked- equal and bilateral Secured at: 21 cm Tube secured with: Tape Dental Injury: Teeth and Oropharynx as per pre-operative assessment

## 2024-09-12 NOTE — Interval H&P Note (Signed)
 History and Physical Interval Note:  09/12/2024 7:10 AM  Diane Vaughn  has presented today for surgery, with the diagnosis of AUB, fibroids.  The various methods of treatment have been discussed with the patient and family. After consideration of risks, benefits and other options for treatment, the patient has consented to  Procedure(s): HYSTERECTOMY, TOTAL, LAPAROSCOPIC, ROBOT-ASSISTED WITH SALPINGECTOMY (Bilateral) CYSTOSCOPY (N/A) as a surgical intervention.  The patient's history has been reviewed, patient examined.  She has a 7/10 headache that started yesterday. She has a history of migraines for which she typically takes imitrex . Her BP is stable and she denies vision changes.  She is stable for surgery.  I have reviewed the patient's chart and labs.  Questions were answered to the patient's satisfaction.     Chyan Carnero LULLA Pa

## 2024-09-12 NOTE — Anesthesia Preprocedure Evaluation (Signed)
 Anesthesia Evaluation  Patient identified by MRN, date of birth, ID band Patient awake    Reviewed: Allergy & Precautions, H&P , NPO status , Patient's Chart, lab work & pertinent test results  Airway Mallampati: II  TM Distance: >3 FB Neck ROM: Full    Dental  (+) Teeth Intact, Dental Advisory Given   Pulmonary neg pulmonary ROS, neg shortness of breath, neg sleep apnea, neg COPD, neg recent URI   breath sounds clear to auscultation       Cardiovascular negative cardio ROS  Rhythm:Regular     Neuro/Psych  Headaches  negative psych ROS   GI/Hepatic negative GI ROS, Neg liver ROS,,,  Endo/Other  negative endocrine ROS    Renal/GU negative Renal ROS  negative genitourinary   Musculoskeletal negative musculoskeletal ROS (+)    Abdominal   Peds  Hematology negative hematology ROS (+) Lab Results      Component                Value               Date                      WBC                      7.2                 05/06/2024                HGB                      11.1                05/06/2024                HCT                      35.4                05/06/2024                MCV                      88                  05/06/2024                PLT                      373                 05/06/2024              Anesthesia Other Findings   Reproductive/Obstetrics Lab Results      Component                Value               Date                      PREGTESTUR               NEGATIVE            09/12/2024  Anesthesia Physical Anesthesia Plan  ASA: 1  Anesthesia Plan: General   Post-op Pain Management: Tylenol  PO (pre-op)*, Celebrex  PO (pre-op)* and Toradol  IV (intra-op)*   Induction:   PONV Risk Score and Plan: 4 or greater and Ondansetron , Dexamethasone , Propofol  infusion and Midazolam   Airway Management Planned: Oral ETT  Additional  Equipment: None  Intra-op Plan:   Post-operative Plan: Extubation in OR  Informed Consent: I have reviewed the patients History and Physical, chart, labs and discussed the procedure including the risks, benefits and alternatives for the proposed anesthesia with the patient or authorized representative who has indicated his/her understanding and acceptance.     Dental advisory given  Plan Discussed with: CRNA  Anesthesia Plan Comments:         Anesthesia Quick Evaluation

## 2024-09-15 ENCOUNTER — Encounter (HOSPITAL_COMMUNITY): Payer: Self-pay | Admitting: Obstetrics and Gynecology

## 2024-09-15 ENCOUNTER — Ambulatory Visit: Payer: Self-pay | Admitting: Obstetrics and Gynecology

## 2024-09-15 LAB — SURGICAL PATHOLOGY

## 2024-09-17 NOTE — Anesthesia Postprocedure Evaluation (Signed)
 Anesthesia Post Note  Patient: Diane Vaughn  Procedure(s) Performed: HYSTERECTOMY, TOTAL, LAPAROSCOPIC, ROBOT-ASSISTED WITH SALPINGECTOMY (Bilateral) CYSTOSCOPY     Patient location during evaluation: PACU Anesthesia Type: General Level of consciousness: awake and alert Pain management: pain level controlled Vital Signs Assessment: post-procedure vital signs reviewed and stable Respiratory status: spontaneous breathing, nonlabored ventilation and respiratory function stable Cardiovascular status: blood pressure returned to baseline and stable Postop Assessment: no apparent nausea or vomiting Anesthetic complications: no   No notable events documented.                 Ledell Codrington

## 2024-09-24 ENCOUNTER — Other Ambulatory Visit: Payer: Self-pay | Admitting: Family Medicine

## 2024-09-26 ENCOUNTER — Encounter: Payer: Self-pay | Admitting: Obstetrics and Gynecology

## 2024-09-26 ENCOUNTER — Ambulatory Visit: Payer: Self-pay | Admitting: Obstetrics and Gynecology

## 2024-09-26 VITALS — BP 110/78 | HR 66 | Temp 97.5°F | Ht <= 58 in | Wt 111.0 lb

## 2024-09-26 DIAGNOSIS — Z09 Encounter for follow-up examination after completed treatment for conditions other than malignant neoplasm: Secondary | ICD-10-CM

## 2024-09-26 NOTE — Progress Notes (Signed)
 "  43 y.o. G51P2002 female 2wk s/p RATLH, BS, cysto for AUB and fibroids here for postop exam. Significant Other.  Due to language barrier, an interpreter was present during the history-taking and subsequent discussion (and for part of the physical exam) with this patient. Works at [employer].  Patient's last menstrual period was 08/26/2024 (approximate). Period Duration (Days): 6-7 Period Pattern: (!) Irregular Menstrual Flow: Heavy Menstrual Control: Maxi pad Menstrual Control Change Freq (Hours): 12 Dysmenorrhea: (!) Severe Dysmenorrhea Symptoms: Cramping, Headache  Pt states she is doing well. Denies N/V, abdominal pain, VB, dysuria. Normal BM and voids.  Taking ibuprofen  PRN.  09/12/24 path: A. UTERUS AND CERVIX, WITH BILATERAL FALLOPIAN TUBES, HYSTERECTOMY:  Cervix     Slight cervicitis.      Negative for dysplasia.  Endometrium     Secretory endometrium.      Negative for endometrial intraepithelial neoplasia (EIN) and  malignancy.  Myometrium     Leiomyoma.      Negative for malignancy.  Bilateral fallopian tube      Unremarkable.      Negative for endometriosis and malignancy.   GYN HISTORY: No sig hx  OB History  Gravida Para Term Preterm AB Living  3 2 2   2   SAB IAB Ectopic Multiple Live Births      2    # Outcome Date GA Lbr Len/2nd Weight Sex Type Anes PTL Lv  3 Gravida              Birth Comments: System Generated. Please review and update pregnancy details.  2 Term      Vag-Spont   LIV  1 Term      Vag-Spont   LIV   Past Medical History:  Diagnosis Date   Abnormal uterine bleeding (AUB)    Dysmenorrhea    IDA (iron  deficiency anemia)    Migraine    Uterine fibroid    Wears glasses    Past Surgical History:  Procedure Laterality Date   CYSTOSCOPY N/A 09/12/2024   Procedure: CYSTOSCOPY;  Surgeon: Dallie Vera GAILS, MD;  Location: [facility] OR;  Service: Gynecology;  Laterality: N/A;   HYSTERECTOMY, TOTAL, LAPAROSCOPIC, ROBOT-ASSISTED WITH SALPINGECTOMY  Bilateral 09/12/2024   Procedure: HYSTERECTOMY, TOTAL, LAPAROSCOPIC, ROBOT-ASSISTED WITH SALPINGECTOMY;  Surgeon: Dallie Vera GAILS, MD;  Location: [facility] OR;  Service: Gynecology;  Laterality: Bilateral;   NO PAST SURGERIES     Current Outpatient Medications on File Prior to Visit  Medication Sig Dispense Refill   acetaminophen  (TYLENOL ) 500 MG tablet Take 2 tablets (1,000 mg total) by mouth every 8 (eight) hours as needed. 90 tablet 0   ibuprofen  (ADVIL ) 800 MG tablet Take 1 tablet (800 mg total) by mouth every 8 (eight) hours as needed for up to 42 doses. 42 tablet 0   Iron , Ferrous Sulfate , 325 (65 Fe) MG TABS Take 325 mg by mouth daily. (Patient taking differently: Take 325 mg by mouth daily.) 30 tablet 11   Multiple Vitamin (MULTI VITAMIN PO) Take 1 tablet by mouth daily after lunch.     oxyCODONE  (ROXICODONE ) 5 MG immediate release tablet On day 1, take 1 tablet by mouth every 4 hours as needed. On day 2, take 1 tablet by mouth every 6 hours as needed. On day 3, take 1 tablet by mouth every 8 hours as needed. On day 4, take 1 tablet by mouth every 12 hours as needed. 15 tablet 0   SUMAtriptan  (IMITREX ) 100 MG tablet Take 1 tablet (100 mg total)  by mouth every 2 (two) hours as needed for migraine. TAKE 1 TABLET BY MOUTH AS NEEDED WHEN SYMPTOMS OCCUR, MAY TAKE 1 MORE TABLET IN 2 HOURS IF NEEDED, NO MORE THAN 2 IN 24 HOURS 10 tablet 1   No current facility-administered medications on file prior to visit.   No Known Allergies   PE Today's Vitals   09/26/24 0857  BP: 110/78  Pulse: 66  Temp: (!) 97.5 F (36.4 C)  TempSrc: Oral  SpO2: 97%  Weight: 111 lb (50.3 kg)  Height: 4' 9 (1.448 m)     Body mass index is 24.02 kg/m.  Physical Exam Vitals reviewed.  Constitutional:      General: She is not in acute distress.    Appearance: Normal appearance.  HENT:     Head: Normocephalic and atraumatic.     Nose: Nose normal.  Eyes:     Extraocular Movements: Extraocular movements intact.      Conjunctiva/sclera: Conjunctivae normal.  Pulmonary:     Effort: Pulmonary effort is normal.  Abdominal:     General: There is no distension.     Palpations: Abdomen is soft.     Tenderness: There is no abdominal tenderness.     Comments: Port site incisions x4, c/d/i  Musculoskeletal:        General: Normal range of motion.     Cervical back: Normal range of motion.  Neurological:     General: No focal deficit present.     Mental Status: She is alert.  Psychiatric:        Mood and Affect: Mood normal.        Behavior: Behavior normal.     Assessment and Plan:        Postop check  Normal postop exam.  Pathology reviewed with patient. Continue recovery, RTO in 4wk All questions answered.    Vera LULLA Pa, MD   "

## 2024-09-26 NOTE — Patient Instructions (Signed)
 Continue pelvic rest for 10 weeks or longer if you are still spotting or cramping. This includes use of tampons, intercourse, douching, public bathing (including hot tubs, pools, the ocean).

## 2024-10-22 ENCOUNTER — Encounter: Payer: Self-pay | Admitting: Obstetrics and Gynecology

## 2024-10-23 ENCOUNTER — Encounter: Payer: Self-pay | Admitting: Obstetrics and Gynecology

## 2024-10-23 ENCOUNTER — Ambulatory Visit (INDEPENDENT_AMBULATORY_CARE_PROVIDER_SITE_OTHER): Payer: Self-pay | Admitting: Obstetrics and Gynecology

## 2024-10-23 VITALS — BP 98/62 | HR 60 | Temp 97.7°F | Wt 111.0 lb

## 2024-10-23 DIAGNOSIS — Z09 Encounter for follow-up examination after completed treatment for conditions other than malignant neoplasm: Secondary | ICD-10-CM

## 2024-10-23 MED ORDER — ACETAMINOPHEN 500 MG PO TABS: 1000.0000 mg | ORAL_TABLET | Freq: Three times a day (TID) | ORAL | 0 refills | Status: AC | PRN

## 2024-10-23 MED ORDER — IBUPROFEN 800 MG PO TABS: 800.0000 mg | ORAL_TABLET | Freq: Three times a day (TID) | ORAL | 0 refills | Status: AC | PRN

## 2024-10-23 NOTE — Progress Notes (Signed)
 "  44 y.o. G56P2002 female 6wk s/p RATLH, BS, cysto for AUB and fibroids here for postop exam. Significant Other.  Works at [employer].  Due to language barrier, an interpreter was present during the history-taking and subsequent discussion (and for part of the physical exam) with this patient.   Patient's last menstrual period was 08/26/2024 (approximate).  Pt took medication for ~3wk ago pelvic pain and pain has gotten better. She has since ran out of her Tylenol  and ibuprofen  and desires refill of medication for pain.   Denies N/V, abdominal pain, VB, dysuria. Normal BM and voids.   09/12/24 path: A. UTERUS AND CERVIX, WITH BILATERAL FALLOPIAN TUBES, HYSTERECTOMY:  Cervix     Slight cervicitis.      Negative for dysplasia.  Endometrium     Secretory endometrium.      Negative for endometrial intraepithelial neoplasia (EIN) and  malignancy.  Myometrium     Leiomyoma.      Negative for malignancy.  Bilateral fallopian tube      Unremarkable.      Negative for endometriosis and malignancy.   GYN HISTORY: No sig hx  OB History  Gravida Para Term Preterm AB Living  3 2 2   2   SAB IAB Ectopic Multiple Live Births      2    # Outcome Date GA Lbr Len/2nd Weight Sex Type Anes PTL Lv  3 Gravida              Birth Comments: System Generated. Please review and update pregnancy details.  2 Term      Vag-Spont   LIV  1 Term      Vag-Spont   LIV   Past Medical History:  Diagnosis Date   Abnormal uterine bleeding (AUB)    Dysmenorrhea    IDA (iron  deficiency anemia)    Migraine    Uterine fibroid    Wears glasses    Past Surgical History:  Procedure Laterality Date   CYSTOSCOPY N/A 09/12/2024   Procedure: CYSTOSCOPY;  Surgeon: Dallie Vera GAILS, MD;  Location: [facility] OR;  Service: Gynecology;  Laterality: N/A;   HYSTERECTOMY, TOTAL, LAPAROSCOPIC, ROBOT-ASSISTED WITH SALPINGECTOMY Bilateral 09/12/2024   Procedure: HYSTERECTOMY, TOTAL, LAPAROSCOPIC, ROBOT-ASSISTED WITH SALPINGECTOMY;   Surgeon: Dallie Vera GAILS, MD;  Location: [facility] OR;  Service: Gynecology;  Laterality: Bilateral;   NO PAST SURGERIES     Current Outpatient Medications on File Prior to Visit  Medication Sig Dispense Refill   Iron , Ferrous Sulfate , 325 (65 Fe) MG TABS Take 325 mg by mouth daily. (Patient taking differently: Take 325 mg by mouth daily.) 30 tablet 11   Multiple Vitamin (MULTI VITAMIN PO) Take 1 tablet by mouth daily after lunch.     SUMAtriptan  (IMITREX ) 100 MG tablet Take 1 tablet (100 mg total) by mouth every 2 (two) hours as needed for migraine. TAKE 1 TABLET BY MOUTH AS NEEDED WHEN SYMPTOMS OCCUR, MAY TAKE 1 MORE TABLET IN 2 HOURS IF NEEDED, NO MORE THAN 2 IN 24 HOURS 10 tablet 1   acetaminophen  (TYLENOL ) 500 MG tablet Take 2 tablets (1,000 mg total) by mouth every 8 (eight) hours as needed. (Patient not taking: Reported on 10/23/2024) 90 tablet 0   ibuprofen  (ADVIL ) 800 MG tablet Take 1 tablet (800 mg total) by mouth every 8 (eight) hours as needed for up to 42 doses. (Patient not taking: Reported on 10/23/2024) 42 tablet 0   oxyCODONE  (ROXICODONE ) 5 MG immediate release tablet On day 1, take 1 tablet  by mouth every 4 hours as needed. On day 2, take 1 tablet by mouth every 6 hours as needed. On day 3, take 1 tablet by mouth every 8 hours as needed. On day 4, take 1 tablet by mouth every 12 hours as needed. (Patient not taking: Reported on 10/23/2024) 15 tablet 0   No current facility-administered medications on file prior to visit.   No Known Allergies   PE Today's Vitals   10/23/24 0909  BP: 98/62  Pulse: 60  Temp: 97.7 F (36.5 C)  TempSrc: Oral  SpO2: 99%  Weight: 111 lb (50.3 kg)      Body mass index is 24.02 kg/m.  Physical Exam Vitals reviewed. Exam conducted with a chaperone present.  Constitutional:      General: She is not in acute distress.    Appearance: Normal appearance.  HENT:     Head: Normocephalic and atraumatic.     Nose: Nose normal.  Eyes:     Extraocular  Movements: Extraocular movements intact.     Conjunctiva/sclera: Conjunctivae normal.  Pulmonary:     Effort: Pulmonary effort is normal.  Abdominal:     General: There is no distension.     Palpations: Abdomen is soft.     Tenderness: There is no abdominal tenderness.     Comments: Port site incisions x4, c/d/i  Genitourinary:    General: Normal vulva.     Exam position: Lithotomy position.     Vagina: Normal. No vaginal discharge.     Adnexa: Right adnexa normal and left adnexa normal.     Comments: Uterus and cervix absent Vaginal cuff intact without erythema or drainage, mild tenderness to palpation. Musculoskeletal:        General: Normal range of motion.     Cervical back: Normal range of motion.  Neurological:     General: No focal deficit present.     Mental Status: She is alert.  Psychiatric:        Mood and Affect: Mood normal.        Behavior: Behavior normal.     Assessment and Plan:        Postop check  Normal postop exam.  Pathology reviewed with patient. Cleared to return to work and for exercise, continue pelvic rest for another 6 weeks RTO in 6wk for final postop All questions answered.    Vera LULLA Pa, MD   "

## 2024-10-23 NOTE — Patient Instructions (Signed)
 Continue pelvic rest for 6 weeks or longer if you are still spotting or cramping. This includes use of tampons, intercourse, douching, public bathing (including hot tubs, pools, the ocean).

## 2024-12-04 ENCOUNTER — Encounter: Payer: Self-pay | Admitting: Obstetrics and Gynecology
